# Patient Record
Sex: Female | Born: 1990 | Hispanic: Yes | Marital: Married | Smoking: Never smoker
Health system: Southern US, Community
[De-identification: ages and names within clinical notes are randomized; demographics above are authoritative.]

## PROBLEM LIST (undated history)

## (undated) ENCOUNTER — Inpatient Hospital Stay (HOSPITAL_COMMUNITY): Payer: Self-pay

## (undated) DIAGNOSIS — F419 Anxiety disorder, unspecified: Secondary | ICD-10-CM

## (undated) DIAGNOSIS — Z8619 Personal history of other infectious and parasitic diseases: Secondary | ICD-10-CM

## (undated) DIAGNOSIS — O339 Maternal care for disproportion, unspecified: Secondary | ICD-10-CM

## (undated) DIAGNOSIS — L910 Hypertrophic scar: Secondary | ICD-10-CM

## (undated) DIAGNOSIS — R51 Headache: Secondary | ICD-10-CM

## (undated) DIAGNOSIS — Z87440 Personal history of urinary (tract) infections: Secondary | ICD-10-CM

## (undated) DIAGNOSIS — R519 Headache, unspecified: Secondary | ICD-10-CM

## (undated) HISTORY — DX: Headache, unspecified: R51.9

## (undated) HISTORY — DX: Anxiety disorder, unspecified: F41.9

## (undated) HISTORY — DX: Hypertrophic scar: L91.0

## (undated) HISTORY — DX: Personal history of urinary (tract) infections: Z87.440

## (undated) HISTORY — DX: Headache: R51

## (undated) HISTORY — DX: Personal history of other infectious and parasitic diseases: Z86.19

---

## 2013-03-08 LAB — OB RESULTS CONSOLE ANTIBODY SCREEN: ANTIBODY SCREEN: NEGATIVE

## 2013-03-08 LAB — OB RESULTS CONSOLE HEPATITIS B SURFACE ANTIGEN: Hepatitis B Surface Ag: NEGATIVE

## 2013-03-08 LAB — OB RESULTS CONSOLE GC/CHLAMYDIA
Chlamydia: NEGATIVE
GC PROBE AMP, GENITAL: NEGATIVE

## 2013-03-08 LAB — OB RESULTS CONSOLE RPR: RPR: NONREACTIVE

## 2013-03-08 LAB — OB RESULTS CONSOLE ABO/RH: RH Type: POSITIVE

## 2013-03-08 LAB — OB RESULTS CONSOLE HIV ANTIBODY (ROUTINE TESTING): HIV: NONREACTIVE

## 2013-06-18 ENCOUNTER — Encounter (HOSPITAL_COMMUNITY): Payer: Self-pay | Admitting: *Deleted

## 2013-06-18 ENCOUNTER — Inpatient Hospital Stay (HOSPITAL_COMMUNITY)
Admission: AD | Admit: 2013-06-18 | Discharge: 2013-06-18 | Disposition: A | Discharge status: Home / Self Care | Payer: BC Managed Care – PPO | Source: Ambulatory Visit | Attending: Obstetrics | Admitting: Obstetrics

## 2013-06-18 ENCOUNTER — Inpatient Hospital Stay (HOSPITAL_COMMUNITY): Payer: BC Managed Care – PPO | Admitting: Anesthesiology

## 2013-06-18 ENCOUNTER — Encounter (HOSPITAL_COMMUNITY)
Admission: AD | Disposition: A | Discharge status: Home / Self Care | Payer: Self-pay | Source: Ambulatory Visit | Attending: Obstetrics & Gynecology

## 2013-06-18 ENCOUNTER — Inpatient Hospital Stay (HOSPITAL_COMMUNITY)
Admission: AD | Admit: 2013-06-18 | Discharge: 2013-06-20 | DRG: 765 | Disposition: A | Discharge status: Home / Self Care | Payer: BC Managed Care – PPO | Source: Ambulatory Visit | Attending: Obstetrics & Gynecology | Admitting: Obstetrics & Gynecology

## 2013-06-18 ENCOUNTER — Encounter (HOSPITAL_COMMUNITY): Payer: BC Managed Care – PPO | Admitting: Anesthesiology

## 2013-06-18 ENCOUNTER — Encounter (HOSPITAL_COMMUNITY): Payer: Self-pay | Admitting: Anesthesiology

## 2013-06-18 DIAGNOSIS — Z9889 Other specified postprocedural states: Secondary | ICD-10-CM

## 2013-06-18 DIAGNOSIS — O411090 Infection of amniotic sac and membranes, unspecified, unspecified trimester, not applicable or unspecified: Secondary | ICD-10-CM | POA: Diagnosis present

## 2013-06-18 DIAGNOSIS — O480 Post-term pregnancy: Principal | ICD-10-CM | POA: Diagnosis present

## 2013-06-18 DIAGNOSIS — D689 Coagulation defect, unspecified: Secondary | ICD-10-CM | POA: Diagnosis present

## 2013-06-18 DIAGNOSIS — O339 Maternal care for disproportion, unspecified: Secondary | ICD-10-CM | POA: Diagnosis not present

## 2013-06-18 DIAGNOSIS — O324XX0 Maternal care for high head at term, not applicable or unspecified: Secondary | ICD-10-CM | POA: Diagnosis present

## 2013-06-18 DIAGNOSIS — D62 Acute posthemorrhagic anemia: Secondary | ICD-10-CM | POA: Diagnosis not present

## 2013-06-18 DIAGNOSIS — O479 False labor, unspecified: Secondary | ICD-10-CM | POA: Insufficient documentation

## 2013-06-18 DIAGNOSIS — D696 Thrombocytopenia, unspecified: Secondary | ICD-10-CM | POA: Diagnosis present

## 2013-06-18 DIAGNOSIS — O9912 Other diseases of the blood and blood-forming organs and certain disorders involving the immune mechanism complicating childbirth: Secondary | ICD-10-CM

## 2013-06-18 DIAGNOSIS — O3660X0 Maternal care for excessive fetal growth, unspecified trimester, not applicable or unspecified: Secondary | ICD-10-CM | POA: Diagnosis present

## 2013-06-18 DIAGNOSIS — O9903 Anemia complicating the puerperium: Secondary | ICD-10-CM | POA: Diagnosis not present

## 2013-06-18 HISTORY — DX: Maternal care for disproportion, unspecified: O33.9

## 2013-06-18 LAB — CBC
HCT: 35.4 % — ABNORMAL LOW (ref 36.0–46.0)
Hemoglobin: 12.4 g/dL (ref 12.0–15.0)
MCH: 30.2 pg (ref 26.0–34.0)
MCHC: 35 g/dL (ref 30.0–36.0)
MCV: 86.3 fL (ref 78.0–100.0)
Platelets: 143 10*3/uL — ABNORMAL LOW (ref 150–400)
RBC: 4.1 MIL/uL (ref 3.87–5.11)
RDW: 13.2 % (ref 11.5–15.5)
WBC: 17.7 10*3/uL — ABNORMAL HIGH (ref 4.0–10.5)

## 2013-06-18 LAB — OB RESULTS CONSOLE GBS: GBS: NEGATIVE

## 2013-06-18 LAB — OB RESULTS CONSOLE RUBELLA ANTIBODY, IGM: RUBELLA: IMMUNE

## 2013-06-18 SURGERY — Surgical Case
Anesthesia: Spinal | Site: Abdomen

## 2013-06-18 MED ORDER — OXYTOCIN 10 UNIT/ML IJ SOLN: 10.0000 [IU] | Freq: Once | INTRAMUSCULAR | Status: DC

## 2013-06-18 MED ORDER — KETOROLAC TROMETHAMINE 60 MG/2ML IM SOLN
60.0000 mg | Freq: Once | INTRAMUSCULAR | Status: AC | PRN
  Administered 2013-06-18: 60 mg via INTRAMUSCULAR

## 2013-06-18 MED ORDER — SCOPOLAMINE 1 MG/3DAYS TD PT72
MEDICATED_PATCH | TRANSDERMAL | Status: AC
  Filled 2013-06-18: qty 1

## 2013-06-18 MED ORDER — LACTATED RINGERS IV SOLN
INTRAVENOUS | Status: DC | PRN
  Administered 2013-06-18 (×3): via INTRAVENOUS

## 2013-06-18 MED ORDER — LACTATED RINGERS IV SOLN
INTRAVENOUS | Status: DC | PRN
  Administered 2013-06-18 (×2): via INTRAVENOUS

## 2013-06-18 MED ORDER — ONDANSETRON HCL 4 MG/2ML IJ SOLN
INTRAMUSCULAR | Status: AC
  Filled 2013-06-18: qty 2

## 2013-06-18 MED ORDER — PHENYLEPHRINE 8 MG IN D5W 100 ML (0.08MG/ML) PREMIX OPTIME
INJECTION | INTRAVENOUS | Status: DC | PRN
  Administered 2013-06-18: 60 ug/min via INTRAVENOUS

## 2013-06-18 MED ORDER — CEFAZOLIN SODIUM-DEXTROSE 2-3 GM-% IV SOLR
INTRAVENOUS | Status: DC | PRN
  Administered 2013-06-18: 2 g via INTRAVENOUS

## 2013-06-18 MED ORDER — KETOROLAC TROMETHAMINE 60 MG/2ML IM SOLN
INTRAMUSCULAR | Status: AC
  Filled 2013-06-18: qty 2

## 2013-06-18 MED ORDER — FENTANYL CITRATE 0.05 MG/ML IJ SOLN
INTRAMUSCULAR | Status: AC
  Filled 2013-06-18: qty 2

## 2013-06-18 MED ORDER — CITRIC ACID-SODIUM CITRATE 334-500 MG/5ML PO SOLN
30.0000 mL | ORAL | Status: DC | PRN
  Administered 2013-06-18: 30 mL via ORAL
  Filled 2013-06-18: qty 15

## 2013-06-18 MED ORDER — ONDANSETRON HCL 4 MG/2ML IJ SOLN
INTRAMUSCULAR | Status: DC | PRN
  Administered 2013-06-18: 4 mg via INTRAVENOUS

## 2013-06-18 MED ORDER — IBUPROFEN 600 MG PO TABS: 600.0000 mg | ORAL_TABLET | Freq: Four times a day (QID) | ORAL | Status: DC | PRN

## 2013-06-18 MED ORDER — MORPHINE SULFATE 0.5 MG/ML IJ SOLN
INTRAMUSCULAR | Status: AC
  Filled 2013-06-18: qty 10

## 2013-06-18 MED ORDER — BUPIVACAINE HCL (PF) 0.25 % IJ SOLN
INTRAMUSCULAR | Status: DC | PRN
  Administered 2013-06-18: 10 mL

## 2013-06-18 MED ORDER — EPHEDRINE 5 MG/ML INJ
INTRAVENOUS | Status: AC
  Filled 2013-06-18: qty 10

## 2013-06-18 MED ORDER — PHENYLEPHRINE 8 MG IN D5W 100 ML (0.08MG/ML) PREMIX OPTIME
INJECTION | INTRAVENOUS | Status: AC
  Filled 2013-06-18: qty 100

## 2013-06-18 MED ORDER — LIDOCAINE HCL (PF) 1 % IJ SOLN
30.0000 mL | INTRAMUSCULAR | Status: DC | PRN
  Filled 2013-06-18: qty 30

## 2013-06-18 MED ORDER — MORPHINE SULFATE (PF) 0.5 MG/ML IJ SOLN
INTRAMUSCULAR | Status: DC | PRN
  Administered 2013-06-18: .1 mg via INTRATHECAL

## 2013-06-18 MED ORDER — ACETAMINOPHEN 325 MG PO TABS: 650.0000 mg | ORAL_TABLET | ORAL | Status: DC | PRN

## 2013-06-18 MED ORDER — LACTATED RINGERS IV BOLUS (SEPSIS)
500.0000 mL | Freq: Once | INTRAVENOUS | Status: AC
  Administered 2013-06-18: 17:00:00 via INTRAVENOUS

## 2013-06-18 MED ORDER — BUPIVACAINE HCL (PF) 0.25 % IJ SOLN
INTRAMUSCULAR | Status: AC
  Filled 2013-06-18: qty 30

## 2013-06-18 MED ORDER — SCOPOLAMINE 1 MG/3DAYS TD PT72
1.0000 | MEDICATED_PATCH | Freq: Once | TRANSDERMAL | Status: DC
  Administered 2013-06-18: 1.5 mg via TRANSDERMAL

## 2013-06-18 MED ORDER — LACTATED RINGERS IV SOLN: 500.0000 mL | INTRAVENOUS | Status: DC | PRN

## 2013-06-18 MED ORDER — FENTANYL CITRATE 0.05 MG/ML IJ SOLN
INTRAMUSCULAR | Status: DC | PRN
  Administered 2013-06-18: 15 ug via INTRATHECAL

## 2013-06-18 MED ORDER — OXYTOCIN 10 UNIT/ML IJ SOLN
INTRAMUSCULAR | Status: AC
  Filled 2013-06-18: qty 4

## 2013-06-18 MED ORDER — OXYTOCIN 40 UNITS IN LACTATED RINGERS INFUSION - SIMPLE MED
INTRAVENOUS | Status: AC
  Filled 2013-06-18: qty 1000

## 2013-06-18 MED ORDER — OXYTOCIN 10 UNIT/ML IJ SOLN
INTRAMUSCULAR | Status: DC | PRN
  Administered 2013-06-18: 40 [IU]

## 2013-06-18 MED ORDER — CEFAZOLIN SODIUM-DEXTROSE 2-3 GM-% IV SOLR
INTRAVENOUS | Status: AC
  Filled 2013-06-18: qty 50

## 2013-06-18 MED ORDER — BUPIVACAINE IN DEXTROSE 0.75-8.25 % IT SOLN
INTRATHECAL | Status: DC | PRN
  Administered 2013-06-18: 1.2 mL via INTRATHECAL

## 2013-06-18 SURGICAL SUPPLY — 32 items
CLAMP CORD UMBIL (MISCELLANEOUS) ×2 IMPLANT
CLOTH BEACON ORANGE TIMEOUT ST (SAFETY) ×2 IMPLANT
DRAPE LG THREE QUARTER DISP (DRAPES) ×2 IMPLANT
DRSG OPSITE POSTOP 4X10 (GAUZE/BANDAGES/DRESSINGS) ×2 IMPLANT
DURAPREP 26ML APPLICATOR (WOUND CARE) ×2 IMPLANT
ELECT REM PT RETURN 9FT ADLT (ELECTROSURGICAL) ×2
ELECTRODE REM PT RTRN 9FT ADLT (ELECTROSURGICAL) ×1 IMPLANT
GLOVE BIO SURGEON STRL SZ 6.5 (GLOVE) ×2 IMPLANT
GLOVE BIOGEL PI IND STRL 7.0 (GLOVE) ×1 IMPLANT
GLOVE BIOGEL PI INDICATOR 7.0 (GLOVE) ×1
GOWN STRL REUS W/TWL LRG LVL3 (GOWN DISPOSABLE) ×4 IMPLANT
KIT ABG SYR 3ML LUER SLIP (SYRINGE) ×2 IMPLANT
NEEDLE HYPO 22GX1.5 SAFETY (NEEDLE) ×2 IMPLANT
NEEDLE HYPO 25X5/8 SAFETYGLIDE (NEEDLE) ×2 IMPLANT
PACK C SECTION WH (CUSTOM PROCEDURE TRAY) ×2 IMPLANT
PAD ABD 8X7 1/2 STERILE (GAUZE/BANDAGES/DRESSINGS) ×2 IMPLANT
PAD OB MATERNITY 4.3X12.25 (PERSONAL CARE ITEMS) ×2 IMPLANT
RTRCTR C-SECT PINK 25CM LRG (MISCELLANEOUS) ×2 IMPLANT
SPONGE GAUZE 4X4 12PLY STER LF (GAUZE/BANDAGES/DRESSINGS) ×2 IMPLANT
SUT PLAIN 2 0 (SUTURE) ×1
SUT PLAIN ABS 2-0 CT1 27XMFL (SUTURE) ×1 IMPLANT
SUT VIC AB 0 CT1 27 (SUTURE) ×2
SUT VIC AB 0 CT1 27XBRD ANBCTR (SUTURE) ×2 IMPLANT
SUT VIC AB 0 CTX 36 (SUTURE) ×2
SUT VIC AB 0 CTX36XBRD ANBCTRL (SUTURE) ×2 IMPLANT
SUT VIC AB 2-0 CT1 27 (SUTURE) ×1
SUT VIC AB 2-0 CT1 TAPERPNT 27 (SUTURE) ×1 IMPLANT
SUT VIC AB 3-0 SH 27 (SUTURE) ×1
SUT VIC AB 3-0 SH 27X BRD (SUTURE) ×1 IMPLANT
SYR CONTROL 10ML LL (SYRINGE) ×2 IMPLANT
TOWEL OR 17X24 6PK STRL BLUE (TOWEL DISPOSABLE) ×2 IMPLANT
TRAY FOLEY CATH 14FR (SET/KITS/TRAYS/PACK) ×2 IMPLANT

## 2013-06-18 NOTE — Progress Notes (Signed)
Changed telemetry monitors with external monitors.

## 2013-06-18 NOTE — Progress Notes (Signed)
Pt does not want to be checked.

## 2013-06-18 NOTE — Discharge Instructions (Signed)
Braxton Hicks Contractions Pregnancy is commonly associated with contractions of the uterus throughout the pregnancy. Towards the end of pregnancy (32 to 34 weeks), these contractions (Braxton Hicks) can develop more often and may become more forceful. This is not true labor because these contractions do not result in opening (dilatation) and thinning of the cervix. They are sometimes difficult to tell apart from true labor because these contractions can be forceful and people have different pain tolerances. You should not feel embarrassed if you go to the hospital with false labor. Sometimes, the only way to tell if you are in true labor is for your caregiver to follow the changes in the cervix. How to tell the difference between true and false labor:  False labor.  The contractions of false labor are usually shorter, irregular and not as hard as those of true labor.  They are often felt in the front of the lower abdomen and in the groin.  They may leave with walking around or changing positions while lying down.  They get weaker and are shorter lasting as time goes on.  These contractions are usually irregular.  They do not usually become progressively stronger, regular and closer together as with true labor.  True labor.  Contractions in true labor last 30 to 70 seconds, become very regular, usually become more intense, and increase in frequency.  They do not go away with walking.  The discomfort is usually felt in the top of the uterus and spreads to the lower abdomen and low back.  True labor can be determined by your caregiver with an exam. This will show that the cervix is dilating and getting thinner. If there are no prenatal problems or other health problems associated with the pregnancy, it is completely safe to be sent home with false labor and await the onset of true labor. HOME CARE INSTRUCTIONS   Keep up with your usual exercises and instructions.  Take medications as  directed.  Keep your regular prenatal appointment.  Eat and drink lightly if you think you are going into labor.  If BH contractions are making you uncomfortable:  Change your activity position from lying down or resting to walking/walking to resting.  Sit and rest in a tub of warm water.  Drink 2 to 3 glasses of water. Dehydration may cause B-H contractions.  Do slow and deep breathing several times an hour. SEEK IMMEDIATE MEDICAL CARE IF:   Your contractions continue to become stronger, more regular, and closer together.  You have a gushing, burst or leaking of fluid from the vagina.  An oral temperature above 102 F (38.9 C) develops.  You have passage of blood-tinged mucus.  You develop vaginal bleeding.  You develop continuous belly (abdominal) pain.  You have low back pain that you never had before.  You feel the baby's head pushing down causing pelvic pressure.  The baby is not moving as much as it used to. Document Released: 02/07/2005 Document Revised: 05/02/2011 Document Reviewed: 11/19/2012 [employer] Patient Information 2014 [employer].  Fetal Movement Counts Patient Name: __________________________________________________ Patient Due Date: ____________________ Performing a fetal movement count is highly recommended in high-risk pregnancies, but it is good for every pregnant woman to do. Your caregiver may ask you to start counting fetal movements at 28 weeks of the pregnancy. Fetal movements often increase:  After eating a full meal.  After physical activity.  After eating or drinking something sweet or cold.  At rest. Pay attention to when you feel   the baby is most active. This will help you notice a pattern of your baby's sleep and wake cycles and what factors contribute to an increase in fetal movement. It is important to perform a fetal movement count at the same time each day when your baby is normally most active.  HOW TO COUNT FETAL  MOVEMENTS 1. Find a quiet and comfortable area to sit or lie down on your left side. Lying on your left side provides the best blood and oxygen circulation to your baby. 2. Write down the day and time on a sheet of paper or in a journal. 3. Start counting kicks, flutters, swishes, rolls, or jabs in a 2 hour period. You should feel at least 10 movements within 2 hours. 4. If you do not feel 10 movements in 2 hours, wait 2 3 hours and count again. Look for a change in the pattern or not enough counts in 2 hours. SEEK MEDICAL CARE IF:  You feel less than 10 counts in 2 hours, tried twice.  There is no movement in over an hour.  The pattern is changing or taking longer each day to reach 10 counts in 2 hours.  You feel the baby is not moving as he or she usually does. Date: ____________ Movements: ____________ Start time: ____________ Finish time: ____________  Date: ____________ Movements: ____________ Start time: ____________ Finish time: ____________ Date: ____________ Movements: ____________ Start time: ____________ Finish time: ____________ Date: ____________ Movements: ____________ Start time: ____________ Finish time: ____________ Date: ____________ Movements: ____________ Start time: ____________ Finish time: ____________ Date: ____________ Movements: ____________ Start time: ____________ Finish time: ____________ Date: ____________ Movements: ____________ Start time: ____________ Finish time: ____________ Date: ____________ Movements: ____________ Start time: ____________ Finish time: ____________  Date: ____________ Movements: ____________ Start time: ____________ Finish time: ____________ Date: ____________ Movements: ____________ Start time: ____________ Finish time: ____________ Date: ____________ Movements: ____________ Start time: ____________ Finish time: ____________ Date: ____________ Movements: ____________ Start time: ____________ Finish time: ____________ Date: ____________  Movements: ____________ Start time: ____________ Finish time: ____________ Date: ____________ Movements: ____________ Start time: ____________ Finish time: ____________ Date: ____________ Movements: ____________ Start time: ____________ Finish time: ____________  Date: ____________ Movements: ____________ Start time: ____________ Finish time: ____________ Date: ____________ Movements: ____________ Start time: ____________ Finish time: ____________ Date: ____________ Movements: ____________ Start time: ____________ Finish time: ____________ Date: ____________ Movements: ____________ Start time: ____________ Finish time: ____________ Date: ____________ Movements: ____________ Start time: ____________ Finish time: ____________ Date: ____________ Movements: ____________ Start time: ____________ Finish time: ____________ Date: ____________ Movements: ____________ Start time: ____________ Finish time: ____________  Date: ____________ Movements: ____________ Start time: ____________ Finish time: ____________ Date: ____________ Movements: ____________ Start time: ____________ Finish time: ____________ Date: ____________ Movements: ____________ Start time: ____________ Finish time: ____________ Date: ____________ Movements: ____________ Start time: ____________ Finish time: ____________ Date: ____________ Movements: ____________ Start time: ____________ Finish time: ____________ Date: ____________ Movements: ____________ Start time: ____________ Finish time: ____________ Date: ____________ Movements: ____________ Start time: ____________ Finish time: ____________  Date: ____________ Movements: ____________ Start time: ____________ Finish time: ____________ Date: ____________ Movements: ____________ Start time: ____________ Finish time: ____________ Date: ____________ Movements: ____________ Start time: ____________ Finish time: ____________ Date: ____________ Movements: ____________ Start time:  ____________ Finish time: ____________ Date: ____________ Movements: ____________ Start time: ____________ Finish time: ____________ Date: ____________ Movements: ____________ Start time: ____________ Finish time: ____________ Date: ____________ Movements: ____________ Start time: ____________ Finish time: ____________  Date: ____________ Movements: ____________ Start time: ____________ Finish time: ____________ Date: ____________ Movements: ____________ Start   time: ____________ Finish time: ____________ Date: ____________ Movements: ____________ Start time: ____________ Finish time: ____________ Date: ____________ Movements: ____________ Start time: ____________ Finish time: ____________ Date: ____________ Movements: ____________ Start time: ____________ Finish time: ____________ Date: ____________ Movements: ____________ Start time: ____________ Finish time: ____________ Date: ____________ Movements: ____________ Start time: ____________ Finish time: ____________  Date: ____________ Movements: ____________ Start time: ____________ Finish time: ____________ Date: ____________ Movements: ____________ Start time: ____________ Finish time: ____________ Date: ____________ Movements: ____________ Start time: ____________ Finish time: ____________ Date: ____________ Movements: ____________ Start time: ____________ Finish time: ____________ Date: ____________ Movements: ____________ Start time: ____________ Finish time: ____________ Date: ____________ Movements: ____________ Start time: ____________ Finish time: ____________ Date: ____________ Movements: ____________ Start time: ____________ Finish time: ____________  Date: ____________ Movements: ____________ Start time: ____________ Finish time: ____________ Date: ____________ Movements: ____________ Start time: ____________ Finish time: ____________ Date: ____________ Movements: ____________ Start time: ____________ Finish time: ____________ Date:  ____________ Movements: ____________ Start time: ____________ Finish time: ____________ Date: ____________ Movements: ____________ Start time: ____________ Finish time: ____________ Date: ____________ Movements: ____________ Start time: ____________ Finish time: ____________ Document Released: 03/09/2006 Document Revised: 01/25/2012 Document Reviewed: 12/05/2011 [employer] Patient Information 2014 [employer].  

## 2013-06-18 NOTE — Progress Notes (Signed)
Call from RN: G1P0 @40 .6 wks for labor check. Uncomplicated pregnancy, GBS neg. Ctx q 5 min per RN. SVE 3.5/90/-3, minimal change from exam in office. NST reactive. Pt requests d/c home. Ok for d/c home, labor precautions.

## 2013-06-18 NOTE — Op Note (Signed)
Preoperative diagnosis: Intrauterine pregnancy at 40 weeks and 6 days                                             Arrest of descent and dilation.  Non-reassuring FHR monitoring  Post operative diagnosis: Same  Anesthesia: Spinal  Anesthesiologist: Dr. Dana AllanAmy Cassidy  Procedure: Primary urgent low transverse cesarean section  Surgeon: Dr. Genia DelMarie-Lyne Brodey Bonn  Assistant: Denton Meekolita Dawson   Estimated blood loss: 500 cc  Procedure:  After being informed of the planned procedure and possible complications including bleeding, infection, injury to other organs, informed consent is obtained. The patient is taken to OR #9 and given spinal anesthesia without complication. She is placed in the dorsal decubitus position with the pelvis tilted to the left. She is then prepped and draped in a sterile fashion. A Foley catheter is inserted in her bladder.  After assessing adequate level of anesthesia, we infiltrate the suprapubic area with 10 cc of Marcaine 0.25 and perform a Pfannenstiel incision which is brought down sharply to the fascia. The fascia is entered in a low transverse fashion. Linea alba is dissected. Peritoneum is entered in a midline fashion. An Alexis retractor is easily positioned. Visceral peritoneum is entered in a low transverse fashion allowing us to safely retract bladder by developing a bladder flap.  The myometrium is then entered in a low transverse fashion; first with knife and then extended bluntly. Amniotic fluid is meconium. We assist the birth of a female  infant in cephalic presentation. Mouth and nose are suctioned. The baby is delivered. The cord is clamped and sectioned. The baby is given to the neonatologist present in the room.  An arterial cord PH is done and 10 cc of blood is drawn from the umbilical vein.The placenta is allowed to deliver spontaneously. It is complete and the cord has 3 vessels. Uterine revision is negative.  We proceed with closure of the myometrium in 2  layers: First with a running locked suture of 0 Vicryl, then with a Lembert suture of 0 Vicryl imbricating the first one. Hemostasis is completed with cauterization on peritoneal edges.  Both paracolic gutters are cleaned. Both tubes and ovaries are assessed and normal. We confirm a satisfactory hemostasis.  Retractors and sponges are removed. Under fascia hemostasis is completed with cauterization.  The parietal peritoneum is closed with a running suture of Vicryl 2-0. The fascia is then closed with 2 running sutures of 0 Vicryl meeting midline. The wound is irrigated with warm saline and hemostasis is completed with cauterization.  The adipose tissue is reapproximated with Plain 2-0 in a running suture. The skin is closed with a subcuticular suture of 3-0 Monocryl and a pressure dressing is applied with a Honeycomb dressing.  Instrument and sponge count is complete x2. Estimated blood loss is 500 cc.  The procedure is well tolerated by the patient who is taken to recovery room in a well and stable condition.  female baby was born at 21:45 and received an Apgar of 9  at 1 minute and 9 at 5 minutes.  PH was 7.12.  Weight was pending.   Specimen: Placenta sent to pathology   Genia DelMarie-Lyne Eara Burruel MD 4/28/201510:45 PM

## 2013-06-18 NOTE — Progress Notes (Signed)
Patient ID: Norma Garcia, female   DOB: 1990/07/15, 23 y.o.   MRN: [id number] S: Becoming increasingly frustrated and exhausted, pain more intense in lower abdomen. Requesting epidural/ (+) pelvic pressure with every contraction.   O: Filed Vitals:   06/18/13 1502 06/18/13 1625 06/18/13 1807 06/18/13 1812  BP: 111/69   135/64  Pulse: 87   90  Temp: 98.3 F (36.8 C) 98.6 F (37 C) 98.7 F (37.1 C)   TempSrc: Oral Oral Oral   Resp: 20   22     FHT:  FHR: 150 bpm, variability: moderate,  accelerations:  Present,  decelerations:  Present variables UC:   regular, every 2-3 minutes SVE:   Dilation: 9 Effacement (%): 90 Station: 0;+1 Exam by:: Carloyn Jaeger Wash Nienhaus CNM   A / P: Spontaneous labor, progressing normally  Fetal Wellbeing:  Category II Pain Control:  Labor support without medications  Anticipated MOD:  NSVD  Kenard Gowerolitta M Eva Griffo, MSN, CNM 06/18/2013, 6:30 PM

## 2013-06-18 NOTE — Progress Notes (Addendum)
Patient ID: Norma Garcia, female   DOB: 1990/07/29, 23 y.o.   MRN: [id number] S: Doing well, increasing back pain - requesting sterile water papules. (+) pelvic pressure, involuntarily pushing.   Norma Garcia: Filed Vitals:   06/18/13 1502 06/18/13 1625  BP: 111/69   Pulse: 87   Temp: 98.3 F (36.8 C) 98.6 F (37 C)  TempSrc: Oral Oral  Resp: 20      FHT:  FHR: 145 bpm, variability: moderate,  accelerations:  Present,  decelerations:  Present variables UC:   regular, every 2-3 minutes, by palpation SVE:   Dilation: 8 Effacement (%): 90 Station: 0 Exam by:: R Macedonio Scallon CNM   A / P: Spontaneous labor, progressing normally  Fetal Wellbeing:  Category II Pain Control:  Labor support without medications and sterile water papules  Anticipated MOD:  NSVD  *Dr. Seymour BarsLavoie present at time of exam / introduced self to patient / reviewed EFM tracing  Kenard Gowerolitta M Quentyn Kolbeck, MSN, CNM 06/18/2013, 4:50 PM

## 2013-06-18 NOTE — Anesthesia Postprocedure Evaluation (Signed)
  Anesthesia Post-op Note  Anesthesia Post Note  Patient: Norma Garcia  Procedure(s) Performed: Procedure(s) (LRB): CESAREAN SECTION (N/A)  Anesthesia type: Spinal  Patient location: PACU  Post pain: Pain level controlled  Post assessment: Post-op Vital signs reviewed  Last Vitals:  Filed Vitals:   06/18/13 2330  BP: 115/87  Pulse: 98  Temp:   Resp: 25    Post vital signs: Reviewed  Level of consciousness: awake  Complications: No apparent anesthesia complications

## 2013-06-18 NOTE — Consult Note (Signed)
Neonatology Note:   Attendance at C-section:    I was asked by Dr. Seymour BarsLavoie to attend this primary C/S at term due [facility]. The mother is a G1P0 O pos, GBS neg with an uncomplicated pregnancy. The baby is known to be LGA. ROM 6 hours prior to delivery, fluid with moderate meconium. Infant vigorous with good spontaneous cry and tone. Needed only minimal bulb suctioning for clear mucous. Ap 9/9. Lungs with few crackles in DR. At 6 minutes, he had some minimal soft grunting, so we did chest PT and he cried vigorously, after which he was improved. I spoke with his parents. I instructed his nurse to allow him to have skin to skin time, being observed closely, and to take him to the CN if he had any increase in the grunting or other respiratory distress. To CN to care of Pediatrician.   Norma Souhristie C. Alli Jasmer, MD

## 2013-06-18 NOTE — Progress Notes (Signed)
G1P0 at 40 6/7 weeks admitted to L+D for waterbirth.  Given birth plan and reviewed with pt and family and support team.  After reactive NST, pt may get in tub per T Fredric MareBailey CNM.

## 2013-06-18 NOTE — Anesthesia Preprocedure Evaluation (Signed)
Anesthesia Evaluation  Patient identified by MRN, date of birth, ID band Patient awake    Reviewed: Allergy & Precautions, H&P , NPO status , Patient's Chart, lab work & pertinent test results, reviewed documented beta blocker date and time   History of Anesthesia Complications Negative for: history of anesthetic complications  Airway Mallampati: II TM Distance: >3 FB Neck ROM: full    Dental  (+) Teeth Intact   Pulmonary neg pulmonary ROS,  breath sounds clear to auscultation        Cardiovascular negative cardio ROS  Rhythm:regular Rate:Normal     Neuro/Psych negative neurological ROS  negative psych ROS   GI/Hepatic negative GI ROS, Neg liver ROS,   Endo/Other  negative endocrine ROS  Renal/GU negative Renal ROS     Musculoskeletal   Abdominal   Peds  Hematology negative hematology ROS (+)   Anesthesia Other Findings   Reproductive/Obstetrics (+) Pregnancy (failure to progress -> C/S)                           Anesthesia Physical Anesthesia Plan  ASA: II and emergent  Anesthesia Plan: Spinal   Post-op Pain Management:    Induction:   Airway Management Planned:   Additional Equipment:   Intra-op Plan:   Post-operative Plan:   Informed Consent: I have reviewed the patients History and Physical, chart, labs and discussed the procedure including the risks, benefits and alternatives for the proposed anesthesia with the patient or authorized representative who has indicated his/her understanding and acceptance.     Plan Discussed with: Surgeon and CRNA  Anesthesia Plan Comments:         Anesthesia Quick Evaluation

## 2013-06-18 NOTE — Anesthesia Procedure Notes (Signed)
Spinal  Patient location during procedure: OR Start time: 06/18/2013 9:25 PM Staffing Performed by: anesthesiologist  Preanesthetic Checklist Completed: patient identified, site marked, surgical consent, pre-op evaluation, timeout performed, IV checked, risks and benefits discussed and monitors and equipment checked Spinal Block Patient position: sitting Prep: site prepped and draped and DuraPrep Patient monitoring: heart rate, cardiac monitor, continuous pulse ox and blood pressure Approach: midline Location: L3-4 Injection technique: single-shot Needle Needle type: Pencan  Needle gauge: 24 G Needle length: 9 cm Assessment Sensory level: T4 Additional Notes Clear free flow CSF on second attempt (after repositioning).  No paresthesia.  Patient tolerated procedure well with no apparent complications.  Jasmine DecemberA. Cassidy, MD

## 2013-06-18 NOTE — MAU Note (Signed)
Contractions  

## 2013-06-18 NOTE — Progress Notes (Signed)
S:  Breathing well with ctx       Position changes - now hands-knees  O:  VS: Blood pressure 111/69, pulse 87, temperature 98.3 F (36.8 C), temperature source Oral, resp. rate 20.        FHR : baseline 165 / variability moderaet / accelerations absent / no decelerations        Toco: contractions every 3-5 minutes      A: Active labor     FHR category 2 with increasing baseline  P:  Report to Raelyn Moraolitta Dawson - CNM on-call       Update dr Hennie DuosLavoie   Norma Garcia CNM, MSN, [facility] 06/18/2013, 4:25 PM

## 2013-06-18 NOTE — Progress Notes (Signed)
S:   Coping well with labor - laboring at bedside         lateral side lying        doula at bedside offering labor support  O:  VS: Blood pressure 111/69, pulse 87, temperature 98.3 F (36.8 C), temperature source Oral, resp. rate 20.        FHR : baseline 145 / variability moderate / accelerations occasional 10x10 - not reactive since admission                  Few mild variable decelerations to nadir 110-120        Toco: contractions every 3-5 minutes / moderate-strong         Cervix : 8cm / 95% / vtx / 0 station / LOT        Membranes: BBOW - AROM - thick MSF / non-particulate  A: active labor - LOT at 0 station      FHR category 2      not candidate for water birth - FHR tracing category 2 and thick MSF   P:  discussed with patient and doula and spouse - not candidate for water birth reminded option for pain management - declines at this time continuous EFM in labor continuous labor support monitor closely for fetal descent re-evaluate cervical progression and descent in 1-2 hours        Marlinda Mikeanya Sherrel Ploch CNM, MSN, [facility] 06/18/2013, 3:47 PM

## 2013-06-18 NOTE — H&P (Signed)
  OB ADMISSION/ HISTORY & PHYSICAL:  Admission Date: 06/18/2013  1:59 PM  Admit Diagnosis: active labor @ 40.6 weeks  Norma Garcia is a 23 y.o. female presenting for onset of labor - prodromal ctx since 2pm yesterday - stronger ~ midnight. Seen in MAU 0200 - no cervical progression. Worsening of ctx and pressure @ 0500 - admit from office at 6-7cm dilation with BBOW.  Prenatal History: G1P0   EDC : 06/12/2013, Set as working upon episode Administrator, [employer]  [facility]  Primary Ob Provider: Dr Ernestina PennaFogleman - seeing CNMs for waterbirth Prenatal course complicated by postdates  Prenatal Labs: ABO, Rh:   O positive Antibody:  negative Rubella:   Immune RPR:   NR HBsAg:   negative HIV:   NR GTT: normal GBS: Negative (04/28 0000)   Medical / Surgical History :  Past medical history: No past medical history on file.   Past surgical history: No past surgical history on file.  Family History: No family history on file.   Social History:  has no tobacco, alcohol, and drug history on file.   Allergies: Reglan    Current Medications at time of admission:  Prior to Admission medications   Medication Sig Start Date End Date Taking? Authorizing Provider  Omega-3 Fatty Acids (FISH OIL) 1000 MG CAPS Take by mouth.    Historical Provider, MD  Prenatal Vit-Fe Fumarate-FA (PRENATAL MULTIVITAMIN) TABS tablet Take 1 tablet by mouth daily at 12 noon.    Historical Provider, MD   Review of Systems: Active FM currently every 3-5 minutes No LOF - membranes bulging in office bloody show present  Physical Exam:  VS: 98.1 - 83 - 20 - 120/79  General: alert and oriented, appears uncomfortable with ctx Heart: RRR Lungs: Clear lung fields Abdomen: Gravid, soft and non-tender, non-distended / uterus: gravid and non-tender Extremities: 1+ pedal edema  Genitalia / VE: in office Dilation: 6.5 Effacement (%): 90 Station: -1  FHR: baseline rate 145 / variability  moderate / accelerations 10x10  occasional variable to 120 TOCO: 3-5  Assessment: 40.[redacted] weeks gestation active stage of labor FHR category 2   Plan:  Admit - expectant management Plan water immersion intrapartum - with water birth after reactive NST  Dr Seymour BarsLavoie notified of admission / plan of care   [name] CNM, MSN, [facility] 06/18/2013, 2:11 PM

## 2013-06-18 NOTE — Transfer of Care (Signed)
Immediate Anesthesia Transfer of Care Note  Patient: Norma Garcia  Procedure(s) Performed: Procedure(s): CESAREAN SECTION (N/A)  Patient Location: PACU  Anesthesia Type:Spinal  Level of Consciousness: awake, alert , oriented and patient cooperative  Airway & Oxygen Therapy: Patient Spontanous Breathing  Post-op Assessment: Report given to PACU RN and Post -op Vital signs reviewed and stable  Post vital signs: Reviewed and stable  Complications: No apparent anesthesia complications

## 2013-06-19 ENCOUNTER — Encounter (HOSPITAL_COMMUNITY): Payer: Self-pay | Admitting: *Deleted

## 2013-06-19 DIAGNOSIS — O339 Maternal care for disproportion, unspecified: Secondary | ICD-10-CM

## 2013-06-19 DIAGNOSIS — Z9889 Other specified postprocedural states: Secondary | ICD-10-CM

## 2013-06-19 HISTORY — DX: Maternal care for disproportion, unspecified: O33.9

## 2013-06-19 LAB — RPR

## 2013-06-19 LAB — CBC
HEMATOCRIT: 29.1 % — AB (ref 36.0–46.0)
HEMOGLOBIN: 10.1 g/dL — AB (ref 12.0–15.0)
MCH: 30.1 pg (ref 26.0–34.0)
MCHC: 34.7 g/dL (ref 30.0–36.0)
MCV: 86.6 fL (ref 78.0–100.0)
Platelets: 120 10*3/uL — ABNORMAL LOW (ref 150–400)
RBC: 3.36 MIL/uL — ABNORMAL LOW (ref 3.87–5.11)
RDW: 13.2 % (ref 11.5–15.5)
WBC: 18.2 10*3/uL — ABNORMAL HIGH (ref 4.0–10.5)

## 2013-06-19 LAB — ABO/RH: ABO/RH(D): O POS

## 2013-06-19 MED ORDER — MEPERIDINE HCL 25 MG/ML IJ SOLN: 6.2500 mg | INTRAMUSCULAR | Status: DC | PRN

## 2013-06-19 MED ORDER — DIPHENHYDRAMINE HCL 50 MG/ML IJ SOLN: 25.0000 mg | INTRAMUSCULAR | Status: DC | PRN

## 2013-06-19 MED ORDER — ONDANSETRON HCL 4 MG PO TABS: 4.0000 mg | ORAL_TABLET | ORAL | Status: DC | PRN

## 2013-06-19 MED ORDER — DIBUCAINE 1 % RE OINT: 1.0000 "application " | TOPICAL_OINTMENT | RECTAL | Status: DC | PRN

## 2013-06-19 MED ORDER — ONDANSETRON HCL 4 MG/2ML IJ SOLN: 4.0000 mg | INTRAMUSCULAR | Status: DC | PRN

## 2013-06-19 MED ORDER — NALOXONE HCL 0.4 MG/ML IJ SOLN: 0.4000 mg | INTRAMUSCULAR | Status: DC | PRN

## 2013-06-19 MED ORDER — FERROUS SULFATE 325 (65 FE) MG PO TABS
325.0000 mg | ORAL_TABLET | Freq: Two times a day (BID) | ORAL | Status: DC
  Administered 2013-06-19: 325 mg via ORAL
  Filled 2013-06-19 (×3): qty 1

## 2013-06-19 MED ORDER — KETOROLAC TROMETHAMINE 30 MG/ML IJ SOLN
30.0000 mg | Freq: Four times a day (QID) | INTRAMUSCULAR | Status: AC | PRN
Start: 2013-06-19 — End: 2013-06-20

## 2013-06-19 MED ORDER — NALBUPHINE HCL 10 MG/ML IJ SOLN: 5.0000 mg | INTRAMUSCULAR | Status: DC | PRN

## 2013-06-19 MED ORDER — LANOLIN HYDROUS EX OINT: 1.0000 "application " | TOPICAL_OINTMENT | CUTANEOUS | Status: DC | PRN

## 2013-06-19 MED ORDER — OXYCODONE-ACETAMINOPHEN 5-325 MG PO TABS: 1.0000 | ORAL_TABLET | ORAL | Status: DC | PRN

## 2013-06-19 MED ORDER — SIMETHICONE 80 MG PO CHEW
80.0000 mg | CHEWABLE_TABLET | ORAL | Status: DC
  Administered 2013-06-19 (×2): 80 mg via ORAL
  Filled 2013-06-19 (×2): qty 1

## 2013-06-19 MED ORDER — DIPHENHYDRAMINE HCL 25 MG PO CAPS
25.0000 mg | ORAL_CAPSULE | Freq: Four times a day (QID) | ORAL | Status: DC | PRN
  Administered 2013-06-19: 25 mg via ORAL
  Filled 2013-06-19: qty 1

## 2013-06-19 MED ORDER — MAGNESIUM HYDROXIDE 400 MG/5ML PO SUSP: 30.0000 mL | ORAL | Status: DC | PRN

## 2013-06-19 MED ORDER — NALOXONE HCL 1 MG/ML IJ SOLN
1.0000 ug/kg/h | INTRAVENOUS | Status: DC | PRN
  Filled 2013-06-19: qty 2

## 2013-06-19 MED ORDER — SIMETHICONE 80 MG PO CHEW
80.0000 mg | CHEWABLE_TABLET | Freq: Three times a day (TID) | ORAL | Status: DC
  Administered 2013-06-19 (×2): 80 mg via ORAL
  Filled 2013-06-19 (×4): qty 1

## 2013-06-19 MED ORDER — ZOLPIDEM TARTRATE 5 MG PO TABS: 5.0000 mg | ORAL_TABLET | Freq: Every evening | ORAL | Status: DC | PRN

## 2013-06-19 MED ORDER — ONDANSETRON HCL 4 MG/2ML IJ SOLN: 4.0000 mg | Freq: Three times a day (TID) | INTRAMUSCULAR | Status: DC | PRN

## 2013-06-19 MED ORDER — FENTANYL CITRATE 0.05 MG/ML IJ SOLN: 25.0000 ug | INTRAMUSCULAR | Status: DC | PRN

## 2013-06-19 MED ORDER — SIMETHICONE 80 MG PO CHEW: 80.0000 mg | CHEWABLE_TABLET | ORAL | Status: DC | PRN

## 2013-06-19 MED ORDER — PRENATAL MULTIVITAMIN CH
1.0000 | ORAL_TABLET | Freq: Every day | ORAL | Status: DC
  Administered 2013-06-19: 1 via ORAL
  Filled 2013-06-19 (×2): qty 1

## 2013-06-19 MED ORDER — MENTHOL 3 MG MT LOZG: 1.0000 | LOZENGE | OROMUCOSAL | Status: DC | PRN

## 2013-06-19 MED ORDER — KETOROLAC TROMETHAMINE 30 MG/ML IJ SOLN: 30.0000 mg | Freq: Four times a day (QID) | INTRAMUSCULAR | Status: AC | PRN

## 2013-06-19 MED ORDER — DIPHENHYDRAMINE HCL 25 MG PO CAPS
25.0000 mg | ORAL_CAPSULE | ORAL | Status: DC | PRN
  Administered 2013-06-19: 25 mg via ORAL
  Filled 2013-06-19: qty 1

## 2013-06-19 MED ORDER — ACETAMINOPHEN 160 MG/5ML PO SOLN: 975.0000 mg | Freq: Four times a day (QID) | ORAL | Status: DC | PRN

## 2013-06-19 MED ORDER — LACTATED RINGERS IV SOLN
INTRAVENOUS | Status: DC
  Administered 2013-06-19: 11:00:00 via INTRAVENOUS

## 2013-06-19 MED ORDER — IBUPROFEN 600 MG PO TABS
600.0000 mg | ORAL_TABLET | Freq: Four times a day (QID) | ORAL | Status: DC
  Administered 2013-06-19 – 2013-06-20 (×6): 600 mg via ORAL
  Filled 2013-06-19 (×6): qty 1

## 2013-06-19 MED ORDER — OXYTOCIN 40 UNITS IN LACTATED RINGERS INFUSION - SIMPLE MED: 62.5000 mL/h | INTRAVENOUS | Status: AC

## 2013-06-19 MED ORDER — IBUPROFEN 600 MG PO TABS: 600.0000 mg | ORAL_TABLET | Freq: Four times a day (QID) | ORAL | Status: DC

## 2013-06-19 MED ORDER — DIPHENHYDRAMINE HCL 50 MG/ML IJ SOLN: 12.5000 mg | INTRAMUSCULAR | Status: DC | PRN

## 2013-06-19 MED ORDER — SODIUM CHLORIDE 0.9 % IJ SOLN: 3.0000 mL | INTRAMUSCULAR | Status: DC | PRN

## 2013-06-19 MED ORDER — WITCH HAZEL-GLYCERIN EX PADS: 1.0000 "application " | MEDICATED_PAD | CUTANEOUS | Status: DC | PRN

## 2013-06-19 MED ORDER — TETANUS-DIPHTH-ACELL PERTUSSIS 5-2.5-18.5 LF-MCG/0.5 IM SUSP: 0.5000 mL | Freq: Once | INTRAMUSCULAR | Status: DC

## 2013-06-19 MED ORDER — SENNOSIDES-DOCUSATE SODIUM 8.6-50 MG PO TABS
2.0000 | ORAL_TABLET | ORAL | Status: DC
  Administered 2013-06-19 (×2): 2 via ORAL
  Filled 2013-06-19 (×2): qty 2

## 2013-06-19 NOTE — Addendum Note (Signed)
Addendum created 06/19/13 0909 by Elbert Ewingsolleen S Ajane Novella, CRNA   Modules edited: Notes Section   Notes Section:  File: [id number]

## 2013-06-19 NOTE — Progress Notes (Signed)
POSTOPERATIVE DAY # 1 S/P CS - arrest of active labor / descent / category 2-3 tracing  S:         Reports feeling              Tolerating po intake / no nausea / no vomiting / + flatus / no BM             Bleeding is light             Pain controlled with motrin             Up ad lib / ambulatory/ voiding QS  Newborn breast-feeding    O:  VS: BP 89/62  Pulse 75  Temp(Src) 97.5 F (36.4 C) (Oral)  Resp 20  Ht 5' 0.25" (1.53 m)  Wt 67.586 kg (149 lb)  BMI 28.87 kg/m2  SpO2 99%  Breastfeeding? Unknown   LABS:               Recent Labs  06/18/13 1449 06/19/13 0553  WBC 17.7* 18.2*  HGB 12.4 10.1*  PLT 143* 120*               Bloodtype: --/--/O POS (04/28 1455)  Rubella: Immune (04/28 1700)                                             I&O: Intake/Output     04/28 0701 - 04/29 0700 04/29 0701 - 04/30 0700   I.V. (mL/kg) 2000 (29.6)    Total Intake(mL/kg) 2000 (29.6)    Urine (mL/kg/hr) 2150    Blood 500    Total Output 2650     Net -650                     Physical Exam:             Alert and Oriented X3  Lungs: Clear and unlabored  Heart: regular rate and rhythm / no mumurs  Abdomen: soft, non-tender, non-distended, active BS             Foley draining dark urine - tea colored             Fundus: firm, non-tender, Ueven             Dressing intact pressure            Lochia: light  Extremities: 1+ pedal edema, no calf pain or tenderness, SCD in place  A:        POD # 1 S/P CS            Mild ABL anemia - stable / no intervention             P:        Routine postoperative care                Norma Garcia CNM, MSN, [facility]ACNM 06/19/2013, 9:03 AM

## 2013-06-19 NOTE — Anesthesia Postprocedure Evaluation (Signed)
  Anesthesia Post-op Note  Patient: Norma Garcia  Procedure(s) Performed: Procedure(s): CESAREAN SECTION (N/A)  Patient Location: PACU and Mother/Baby  Anesthesia Type:Spinal  Level of Consciousness: awake, alert  and oriented  Airway and Oxygen Therapy: Patient Spontanous Breathing  Post-op Pain: mild  Post-op Assessment: Patient's Cardiovascular Status Stable, Respiratory Function Stable, No signs of Nausea or vomiting and Pain level controlled  Post-op Vital Signs: stable  Complications: No apparent anesthesia complications

## 2013-06-20 ENCOUNTER — Encounter (HOSPITAL_COMMUNITY): Payer: Self-pay | Admitting: Obstetrics & Gynecology

## 2013-06-20 MED ORDER — OXYCODONE-ACETAMINOPHEN 5-325 MG PO TABS: 1.0000 | ORAL_TABLET | ORAL | Status: DC | PRN

## 2013-06-20 MED ORDER — FERROUS SULFATE 325 (65 FE) MG PO TABS: 325.0000 mg | ORAL_TABLET | Freq: Every day | ORAL | Status: DC

## 2013-06-20 MED ORDER — IBUPROFEN 600 MG PO TABS: 600.0000 mg | ORAL_TABLET | Freq: Four times a day (QID) | ORAL | Status: DC

## 2013-06-20 NOTE — Progress Notes (Signed)
POD # 2  Subjective: Pt reports feeling well, desires early discharge/ Pain controlled with Motrin Tolerating po/Voiding without problems/ No n/v/ Flatus present Activity: ad lib Bleeding is light Newborn info:  Information for the patient's newborn:  Kellie ShropshireJacovozzi, Boy Vanesha [[id number]][[id number]]  female  / Circumcision: not planning/ Feeding: breast   Objective: VS: VS:  Filed Vitals:   06/19/13 1430 06/19/13 1854 06/19/13 2204 06/20/13 0614  BP: 97/58 110/72 120/76 106/57  Pulse: 91 88 103 93  Temp: 97.9 F (36.6 C) 99.2 F (37.3 C) 98.6 F (37 C) 97.6 F (36.4 C)  TempSrc: Oral Oral Oral Oral  Resp: 16 18 18 16   Height:      Weight:      SpO2: 97%   98%    I&O: Intake/Output     04/29 0701 - 04/30 0700 04/30 0701 - 05/01 0700   I.V. (mL/kg)     Total Intake(mL/kg)     Urine (mL/kg/hr) 4500 (2.8)    Blood     Total Output 4500     Net -4500          Urine Occurrence 2 x      LABS:  Recent Labs  06/18/13 1449 06/19/13 0553  WBC 17.7* 18.2*  HGB 12.4 10.1*  PLT 143* 120*                           Physical Exam:  General: alert and cooperative CV: Regular rate and rhythm Resp: CTA bilaterally Abdomen: soft, nontender, normal bowel sounds Incision: healing well, no drainage, no erythema, no hernia, no seroma, no swelling, well approximated, honeycomb c/d/i Uterine Fundus: firm, below umbilicus, nontender Lochia: minimal Ext: extremities normal, atraumatic, no cyanosis or edema and Homans sign is negative, no sign of DVT    Assessment: POD # 2/ G1P1001/ S/P C/Section d/t arrest of descent/dilation Mild ABL anemia Doing well and stable for discharge home  Plan: Discharge home RX's: Ibuprofen 600mg  po Q 6 hrs prn pain #30 Refill x 1 Percocet 5/325 1 - 2 tabs po every 4 hrs prn pain #30 Refill x 0 Niferex 150mg  po QD #30 Refill x 1 [facility] booklet given    Signed: Lawernce PittsMelanie N Acie Custis, MSN, CNM 06/20/2013, 11:36 AM

## 2013-06-20 NOTE — Addendum Note (Signed)
Addendum created 06/20/13 2308 by Dana AllanAmy Leilanie Rauda, MD   Modules edited: Anesthesia Attestations

## 2013-06-20 NOTE — Discharge Summary (Signed)
  POSTOPERATIVE DISCHARGE SUMMARY:  Patient ID: Norma Garcia MRN: [id number] DOB/AGE: [birth year] 22 y.o.  Admit date: 06/18/2013 Admission Diagnoses: 40.[redacted] weeks gestation, active labor   Discharge date: 06/20/2013 Discharge Diagnoses: S/P Primary C/S on 06/18/13, ABL anemia        Prenatal history: G1P1001   EDC : 06/12/2013, Alternate EDD Entry  [facility] since [redacted] wks gestation. Primary provider: Dr. Ernestina PennaFogleman/ CNM for waterbirth Prenatal course complicated by postdates, excessive wt gain  Prenatal labs: ABO, Rh: --/--/O POS (04/28 1455)  Antibody: Negative (01/16 0000) Rubella:  Immune RPR: NON REAC (04/28 1449)  HBsAg: Negative (01/16 0000)  HIV: Non-reactive (01/16 0000)  GBS: Negative (04/28 0000)  GTT: 130  Medical / Surgical History :  Past medical history:  Past Medical History  Diagnosis Date  . Postpartum care following cesarean delivery (4/28) 06/19/2013  . 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 Cesarean section N/A 06/18/2013    Procedure: CESAREAN SECTION;  Surgeon: Genia DelMarie-Lyne Lavoie, MD;  Location: [facility];  Service: Obstetrics;  Laterality: N/A;     Medications on Admission: Prescriptions prior to admission  Medication Sig Dispense Refill  . Prenatal Vit-Fe Fumarate-FA (PRENATAL MULTIVITAMIN) TABS tablet Take 1 tablet by mouth daily at 12 noon.        Allergies: Reglan   Intrapartum Course:  Admited for active labor, AROM with thick MSF, cat II fetal tracing, arrest of dilation and descent, fetal tachycardia, PCS  Postpartum Course:  ABL anemia, Mild Thrombocytopenia  Physical Exam:   VSS: Blood pressure 106/57, pulse 93, temperature 97.6 F (36.4 C), temperature source Oral, resp. rate 16, height 5' 0.25" (1.53 m), weight 67.586 kg (149 lb), SpO2 98.00%, unknown if currently breastfeeding.  LABS:  Recent  Labs  06/18/13 1449 06/19/13 0553  WBC 17.7* 18.2*  HGB 12.4 10.1*  PLT 143* 120*    Newborn Data Live born female  Birth Weight: 8 lb 8.7 oz (3875 g) APGAR: 9, 9  See operative report for further details  Home with mother.  Discharge Instructions:  Wound Care: keep clean and dry / remove honeycomb POD 5 Postpartum Instructions: [facility] discharge booklet - instructions reviewed Medications:    Medication List         ferrous sulfate 325 (65 FE) MG tablet  Take 1 tablet (325 mg total) by mouth daily.     ibuprofen 600 MG tablet  Commonly known as:  ADVIL,MOTRIN  Take 1 tablet (600 mg total) by mouth every 6 (six) hours.     oxyCODONE-acetaminophen 5-325 MG per tablet  Commonly known as:  PERCOCET/ROXICET  Take 1-2 tablets by mouth every 4 (four) hours as needed for severe pain (moderate - severe pain).     prenatal multivitamin Tabs tablet  Take 1 tablet by mouth daily at 12 noon.           Follow-up Information   [facility] A[facility]. Schedule an appointment as soon as possible for a visit in 6 weeks.   Specialty:  Obstetrics and Gynecology   Contact information:   [address] [place] [id number] [phone]         Signed: Lawernce PittsMelanie N Amariah Kierstead [facility] PaWHNP-BC, MSN 06/20/2013, 12:10 PM

## 2013-06-21 NOTE — Discharge Summary (Signed)
Reviewed and agree with note and plan. V.Tallan Sandoz, MD  

## 2013-10-25 ENCOUNTER — Encounter: Payer: Self-pay | Admitting: Obstetrics and Gynecology

## 2013-12-23 ENCOUNTER — Encounter (HOSPITAL_COMMUNITY): Payer: Self-pay | Admitting: Obstetrics & Gynecology

## 2013-12-25 ENCOUNTER — Encounter: Payer: BC Managed Care – PPO | Admitting: Obstetrics and Gynecology

## 2013-12-25 ENCOUNTER — Telehealth: Payer: Self-pay | Admitting: Obstetrics and Gynecology

## 2013-12-25 ENCOUNTER — Encounter: Payer: Self-pay | Admitting: Obstetrics and Gynecology

## 2013-12-25 NOTE — Telephone Encounter (Signed)
Pt arrived for her appointment today with her 6 month old baby. Please schedule this patient for a NGYN appointment with Dr. Silva only because she is from [place]. I told patient you would call her some time today with an appointment. °

## 2013-12-25 NOTE — Telephone Encounter (Signed)
Please call patient and schedule her with next available new gyn appointment with Dr. Edward JollySilva. I do not believe this is referral. Can have next open new gyn with Dr. Edward JollySilva. Thank you.

## 2013-12-25 NOTE — Telephone Encounter (Signed)
Pt calling back to schedule appointment with Dr Edward JollySilva.

## 2013-12-25 NOTE — Telephone Encounter (Signed)
Attempt to return call to patent, VM box not set up, unable to leave message.

## 2013-12-25 NOTE — Telephone Encounter (Signed)
Not needed

## 2013-12-25 NOTE — Telephone Encounter (Signed)
Pt arrived for her appointment today with her 856 month old baby. Please schedule this patient for a NGYN appointment with Dr. Edward JollySilva only because she is from [place]. I told patient you would call her some time today with an appointment.

## 2013-12-26 NOTE — Telephone Encounter (Signed)
Call to patient. Offered to reschedule appointment at a time when she would have child care for her baby. She states she can usually have her husband stay home with baby so will make those plans for next appointment. Has been 2 years since last check-up. Still breastfeeding and would like Dr Edward JollySilva to check breast but denies any current problems. NGYN appointment scheduled for 01-24-14 at 930. Patient agreeable.

## 2013-12-27 NOTE — Telephone Encounter (Signed)
Scheduled for 01/24/14 with Dr. Edward JollySilva by Kennon RoundsSally.

## 2014-01-24 ENCOUNTER — Ambulatory Visit (INDEPENDENT_AMBULATORY_CARE_PROVIDER_SITE_OTHER): Payer: BC Managed Care – PPO | Admitting: Obstetrics and Gynecology

## 2014-01-24 ENCOUNTER — Encounter: Payer: Self-pay | Admitting: Obstetrics and Gynecology

## 2014-01-24 VITALS — BP 98/60 | HR 70 | Resp 14 | Ht 59.0 in | Wt 108.2 lb

## 2014-01-24 DIAGNOSIS — Z01419 Encounter for gynecological examination (general) (routine) without abnormal findings: Secondary | ICD-10-CM

## 2014-01-24 DIAGNOSIS — Z0000 Encounter for general adult medical examination without abnormal findings: Secondary | ICD-10-CM

## 2014-01-24 LAB — COMPREHENSIVE METABOLIC PANEL
ALT: 14 U/L (ref 0–35)
AST: 20 U/L (ref 0–37)
Albumin: 4.5 g/dL (ref 3.5–5.2)
Alkaline Phosphatase: 77 U/L (ref 39–117)
BILIRUBIN TOTAL: 0.5 mg/dL (ref 0.2–1.2)
BUN: 18 mg/dL (ref 6–23)
CALCIUM: 9.5 mg/dL (ref 8.4–10.5)
CO2: 27 meq/L (ref 19–32)
CREATININE: 0.62 mg/dL (ref 0.50–1.10)
Chloride: 103 mEq/L (ref 96–112)
GLUCOSE: 73 mg/dL (ref 70–99)
Potassium: 4 mEq/L (ref 3.5–5.3)
Sodium: 139 mEq/L (ref 135–145)
Total Protein: 7.2 g/dL (ref 6.0–8.3)

## 2014-01-24 LAB — POCT URINALYSIS DIPSTICK
Bilirubin, UA: NEGATIVE
Blood, UA: NEGATIVE
Glucose, UA: NEGATIVE
Ketones, UA: NEGATIVE
Leukocytes, UA: NEGATIVE
Nitrite, UA: NEGATIVE
PH UA: 5
PROTEIN UA: NEGATIVE
UROBILINOGEN UA: NEGATIVE

## 2014-01-24 LAB — CBC
HEMATOCRIT: 42.6 % (ref 36.0–46.0)
HEMOGLOBIN: 14.3 g/dL (ref 12.0–15.0)
MCH: 29 pg (ref 26.0–34.0)
MCHC: 33.6 g/dL (ref 30.0–36.0)
MCV: 86.4 fL (ref 78.0–100.0)
MPV: 13.1 fL — AB (ref 9.4–12.4)
Platelets: 225 10*3/uL (ref 150–400)
RBC: 4.93 MIL/uL (ref 3.87–5.11)
RDW: 14 % (ref 11.5–15.5)
WBC: 7.2 10*3/uL (ref 4.0–10.5)

## 2014-01-24 NOTE — Progress Notes (Signed)
Patient ID: Norma Garcia, female   DOB: 18-May-1990, 23 y.o.   MRN: [id number] 23 y.o. G1P1001 MarriedCaucasianF here for annual exam.   PCP:  None  Had a Cesarean Section for failure to progress.  Dilated to 9.5 cm.  LiIttle boy weighted almost 4 Kg. No diabetes.   Had Mirena IUD placed 3 months post partum.  Placed under ultrasound guidance.  Feels more anxious and has increased acne.   Anxiety is diminishing.  Is not certain if this is premenstrual.  No depression.  Decreased libido.  No menses.  Thinking about a change in contraception.  Still breast feeding.   Going to [place] in one week.  Does not want to make any changes in contraception until after return from travel.   Patient's last menstrual period was 08/21/2012.          Sexually active: Yes.   female The current method of family planning is IUD.-Mirena inserted 07/2013.    Exercising: No.  none. Smoker:  no  Health Maintenance: Pap:  08/2012 wnl History of abnormal Pap:  no MMG:  --- Colonoscopy:  --- BMD:   --- TDaP:  2014 Screening Labs:   Hb today: 14.2, Urine today: neg   reports that she has never smoked. She does not have any smokeless tobacco history on file. She reports that she does not drink alcohol or use illicit drugs.  Past Medical History  Diagnosis Date  . Postpartum care following cesarean delivery (4/28) 06/19/2013  . 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 Cesarean section N/A 06/18/2013    Procedure: CESAREAN SECTION;  Surgeon: Genia DelMarie-Lyne Lavoie, MD;  Location: [facility];  Service: Obstetrics;  Laterality: N/A;    Current Outpatient Prescriptions  Medication Sig Dispense Refill  . Prenatal Vit-Fe Fumarate-FA (PRENATAL MULTIVITAMIN) TABS tablet Take 1 tablet by mouth daily at 12 noon.     No current facility-administered medications for this visit.    Family History  Problem Relation Age of Onset  . Hyperlipidemia Father   .  Diabetes Paternal Grandmother     ROS:  Pertinent items are noted in HPI.  Otherwise, a comprehensive ROS was negative.  Exam:   BP 98/60 mmHg  Pulse 70  Resp 14  Ht 4\' 11"  (1.499 m)  Wt 108 lb 3.2 oz (49.079 kg)  BMI 21.84 kg/m2  LMP 08/21/2012     Height: 4\' 11"  (149.9 cm)  Ht Readings from Last 3 Encounters:  01/24/14 4\' 11"  (1.499 m)  06/19/13 5' 0.25" (1.53 m)  06/18/13 5' 0.25" (1.53 m)    General appearance: alert, cooperative and appears stated age Head: Normocephalic, without obvious abnormality, atraumatic Neck: no adenopathy, supple, symmetrical, trachea midline and thyroid normal to inspection and palpation Lungs: clear to auscultation bilaterally Breasts: normal appearance, no masses or tenderness, Inspection negative, No nipple retraction or dimpling, No nipple discharge or bleeding Heart: regular rate and rhythm Abdomen: soft, non-tender; bowel sounds normal; no masses,  no organomegaly Extremities: extremities normal, atraumatic, no cyanosis or edema Skin: Skin color, texture, turgor normal. No rashes or lesions Lymph nodes: Cervical, supraclavicular, and axillary nodes normal. No abnormal inguinal nodes palpated Neurologic: Grossly normal   Pelvic: External genitalia:  no lesions              Urethra:  normal appearing urethra with no masses, tenderness or lesions              Bartholins and Skenes: normal  Vagina: normal appearing vagina with normal color and discharge, no lesions              Cervix: no lesions and IUD strings noted.              Pap taken: No. Bimanual Exam:  Uterus:  normal size, contour, position, consistency, mobility, non-tender              Adnexa: normal adnexa and no mass, fullness, tenderness               A:  Well Woman with normal exam Mirena IUD patient.  History of Cesarean Section.   P:   pap smear in 2017. counseled on breast self exam CBC, CMP, Vit D. Discussed ParaGard IUD.  Will consider.  return  annually or prn  An After Visit Summary was printed and given to the patient.

## 2014-01-24 NOTE — Patient Instructions (Signed)
Intrauterine Device Information An intrauterine device (IUD) is inserted into your uterus to prevent pregnancy. There are two types of IUDs available:   Copper IUD--This type of IUD is wrapped in copper wire and is placed inside the uterus. Copper makes the uterus and fallopian tubes produce a fluid that kills sperm. The copper IUD can stay in place for 10 years.  Hormone IUD--This type of IUD contains the hormone progestin (synthetic progesterone). The hormone thickens the cervical mucus and prevents sperm from entering the uterus. It also thins the uterine lining to prevent implantation of a fertilized egg. The hormone can weaken or kill the sperm that get into the uterus. One type of hormone IUD can stay in place for 5 years, and another type can stay in place for 3 years. Your health care provider will make sure you are a good candidate for a contraceptive IUD. Discuss with your health care provider the possible side effects.  ADVANTAGES OF AN INTRAUTERINE DEVICE  IUDs are highly effective, reversible, long acting, and low maintenance.   There are no estrogen-related side effects.   An IUD can be used when breastfeeding.   IUDs are not associated with weight gain.   The copper IUD works immediately after insertion.   The hormone IUD works right away if inserted within 7 days of your period starting. You will need to use a backup method of birth control for 7 days if the hormone IUD is inserted at any other time in your cycle.  The copper IUD does not interfere with your female hormones.   The hormone IUD can make heavy menstrual periods lighter and decrease cramping.   The hormone IUD can be used for 3 or 5 years.   The copper IUD can be used for 10 years. DISADVANTAGES OF AN INTRAUTERINE DEVICE  The hormone IUD can be associated with irregular bleeding patterns.   The copper IUD can make your menstrual flow heavier and more painful.   You may experience cramping and  vaginal bleeding after insertion.  Document Released: 01/12/2004 Document Revised: 10/10/2012 Document Reviewed: 07/29/2012 ExitCare Patient Information 2015 [employer]. This information is not intended to replace advice given to you by your health care provider. Make sure you discuss any questions you have with your health care provider.  

## 2014-01-25 LAB — VITAMIN D 25 HYDROXY (VIT D DEFICIENCY, FRACTURES): Vit D, 25-Hydroxy: 33 ng/mL (ref 30–100)

## 2014-01-27 LAB — HEMOGLOBIN, FINGERSTICK: Hemoglobin, fingerstick: 14.2 g/dL (ref 12.0–16.0)

## 2014-09-02 LAB — HM PAP SMEAR

## 2014-11-07 LAB — OB RESULTS CONSOLE HIV ANTIBODY (ROUTINE TESTING): HIV: NONREACTIVE

## 2014-11-07 LAB — OB RESULTS CONSOLE RUBELLA ANTIBODY, IGM: Rubella: IMMUNE

## 2014-11-07 LAB — OB RESULTS CONSOLE RPR: RPR: NONREACTIVE

## 2014-11-07 LAB — OB RESULTS CONSOLE ABO/RH: RH Type: POSITIVE

## 2014-11-07 LAB — OB RESULTS CONSOLE ANTIBODY SCREEN: ANTIBODY SCREEN: NEGATIVE

## 2014-11-07 LAB — OB RESULTS CONSOLE HEPATITIS B SURFACE ANTIGEN: Hepatitis B Surface Ag: NEGATIVE

## 2014-11-14 LAB — OB RESULTS CONSOLE GC/CHLAMYDIA
Chlamydia: NEGATIVE
GC PROBE AMP, GENITAL: NEGATIVE

## 2015-02-22 NOTE — L&D Delivery Note (Signed)
Delivery Note  First Stage: Labor onset: 0230 Augmentation : none Analgesia /Anesthesia intrapartum: none - 1 attempt of nitrous oxide, but pt declined use after 1 attempt SROM at 0548  Second Stage: Complete dilation at 0613 Onset of pushing at 0615 FHR second stage 150  Delivery of a viable female at [address] by CNM in ROA position Loose nuchal cord x 1 Cord double clamped after cessation of pulsation, cut by doula Cord blood sample collected   Third Stage: Placenta delivered via Tomasa BlaseSchultz intact with 3 VC @ [phone] Placenta disposition: L&D Uterine tone firm / bleeding moderate  2nd vaginal/perineal laceration identified  Anesthesia for repair: 1% Lidocaine Repair 2-0, 3-0, 4-0 vicryl Est. Blood Loss (mL): 350  Complications: none  Mom to postpartum.  Baby to Couplet care / Skin to Skin.  Newborn: Birth Weight: 8 lbs 12.6 oz  Apgar Scores: 6/9 Feeding planned: breast  Raelyn MoraAWSON, Koen Antilla, M  MSN, CNM 06/18/2015, 9:27 AM

## 2015-05-18 LAB — OB RESULTS CONSOLE GBS: STREP GROUP B AG: NEGATIVE

## 2015-05-26 ENCOUNTER — Encounter (HOSPITAL_COMMUNITY): Payer: Self-pay | Admitting: *Deleted

## 2015-05-26 ENCOUNTER — Telehealth (HOSPITAL_COMMUNITY): Payer: Self-pay | Admitting: *Deleted

## 2015-05-26 NOTE — Telephone Encounter (Signed)
Interpreter number [phone]

## 2015-05-26 NOTE — Telephone Encounter (Signed)
Preadmission screen  

## 2015-06-01 ENCOUNTER — Observation Stay (HOSPITAL_COMMUNITY): Payer: BLUE CROSS/BLUE SHIELD

## 2015-06-01 ENCOUNTER — Encounter (HOSPITAL_COMMUNITY): Payer: Self-pay

## 2015-06-01 ENCOUNTER — Observation Stay (HOSPITAL_COMMUNITY)
Admission: RE | Admit: 2015-06-01 | Discharge: 2015-06-01 | Disposition: A | Discharge status: Home / Self Care | Payer: BLUE CROSS/BLUE SHIELD | Source: Ambulatory Visit | Attending: Obstetrics and Gynecology | Admitting: Obstetrics and Gynecology

## 2015-06-01 VITALS — BP 92/53 | HR 89 | Temp 98.3°F | Resp 18 | Ht <= 58 in | Wt 141.0 lb

## 2015-06-01 DIAGNOSIS — O288 Other abnormal findings on antenatal screening of mother: Secondary | ICD-10-CM

## 2015-06-01 DIAGNOSIS — F419 Anxiety disorder, unspecified: Secondary | ICD-10-CM | POA: Insufficient documentation

## 2015-06-01 DIAGNOSIS — O471 False labor at or after 37 completed weeks of gestation: Secondary | ICD-10-CM | POA: Insufficient documentation

## 2015-06-01 DIAGNOSIS — O34219 Maternal care for unspecified type scar from previous cesarean delivery: Secondary | ICD-10-CM | POA: Diagnosis not present

## 2015-06-01 DIAGNOSIS — O99343 Other mental disorders complicating pregnancy, third trimester: Secondary | ICD-10-CM | POA: Diagnosis not present

## 2015-06-01 DIAGNOSIS — Z3A37 37 weeks gestation of pregnancy: Secondary | ICD-10-CM

## 2015-06-01 DIAGNOSIS — O321XX0 Maternal care for breech presentation, not applicable or unspecified: Secondary | ICD-10-CM | POA: Diagnosis not present

## 2015-06-01 DIAGNOSIS — Z3A38 38 weeks gestation of pregnancy: Secondary | ICD-10-CM | POA: Diagnosis not present

## 2015-06-01 MED ORDER — CITRIC ACID-SODIUM CITRATE 334-500 MG/5ML PO SOLN
ORAL | Status: AC
  Filled 2015-06-01: qty 15

## 2015-06-01 MED ORDER — TERBUTALINE SULFATE 1 MG/ML IJ SOLN
INTRAMUSCULAR | Status: AC
  Filled 2015-06-01: qty 1

## 2015-06-01 MED ORDER — LACTATED RINGERS IV SOLN
INTRAVENOUS | Status: DC
  Administered 2015-06-01: 10:00:00 via INTRAVENOUS

## 2015-06-01 NOTE — H&P (Signed)
Norma Garcia is a 25 y.o. female presenting for ECV due to frank Garcia presentation.  Maternal Medical History:  Contractions: Onset was less than 1 hour ago.   Frequency: rare.    Fetal activity: Perceived fetal activity is normal.   Last perceived fetal movement was greater than 24 hours ago.    Prenatal Complications - Diabetes: none.    OB History    Gravida Para Term Preterm AB TAB SAB Ectopic Multiple Living   2 1 1  0 0 0 0 0 0 1     Past Medical History  Diagnosis Date  . Postpartum care following cesarean delivery (4/28) 06/19/2013  . 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 Keloid   . Anxiety   . Headache     occasional migraines during second trimester; none during third trimester   Past Surgical History  Procedure Laterality Date  . Cesarean section N/A 06/18/2013    Procedure: CESAREAN SECTION;  Surgeon: Genia DelMarie-Lyne Lavoie, MD;  Location: [facility];  Service: Obstetrics;  Laterality: N/A;   Family History: family history includes Diabetes in her paternal grandmother; Hyperlipidemia in her father. Social History:  reports that she has never smoked. She has never used smokeless tobacco. She reports that she does not drink alcohol or use illicit drugs.   Prenatal Transfer Tool  Maternal Diabetes: No Genetic Screening: Normal Maternal Ultrasounds/Referrals: Normal Fetal Ultrasounds or other Referrals:  None Maternal Substance Abuse:  No Significant Maternal Medications:  None Significant Maternal Lab Results:  None Other Comments:  None  Review of Systems  Constitutional: Negative.   All other systems reviewed and are negative.     Blood pressure 119/74, pulse 93, temperature 98.3 F (36.8 C), temperature source Oral, resp. rate 20, height 4\' 9"  (1.448 m), weight 63.957 kg (141 lb), last menstrual period 09/04/2014, unknown if currently breastfeeding. Maternal Exam:  Uterine Assessment: Contraction strength is mild.  Contraction frequency is rare.    Abdomen: Patient reports no abdominal tenderness. Surgical scars: low transverse.   Fetal presentation: Garcia  Introitus: Normal vulva. Normal vagina.  Ferning test: not done.  Nitrazine test: not done. Amniotic fluid character: not assessed.  Pelvis: adequate for delivery.      Physical Exam  Nursing note and vitals reviewed. Constitutional: She is oriented to person, place, and time. She appears well-developed and well-nourished.  HENT:  Head: Normocephalic and atraumatic.  Neck: Normal range of motion. Neck supple.  Cardiovascular: Normal rate and regular rhythm.   Respiratory: Effort normal and breath sounds normal.  GI: Soft. Bowel sounds are normal.  Genitourinary: Vagina normal and uterus normal.  Musculoskeletal: Normal range of motion.  Neurological: She is alert and oriented to person, place, and time.  Skin: Skin is warm and dry.  Psychiatric: She has a normal mood and affect.    Prenatal labs: ABO, Rh: O/Positive/-- (09/16 0000) Antibody: Negative (09/16 0000) Rubella: Immune (09/16 0000) RPR: Nonreactive (09/16 0000)  HBsAg: Negative (09/16 0000)  HIV: Non-reactive (09/16 0000)  GBS: Negative (03/27 0000)   Assessment/Plan: 37 wk iup Norma Garcia for ECV Consent done.   Norma Garcia 06/01/2015, 8:58 AM

## 2015-06-01 NOTE — Progress Notes (Signed)
No complaints. No pain BP 119/74 mmHg  Pulse 93  Temp(Src) 98.3 F (36.8 C) (Oral)  Resp 20  Ht 4\' 9"  (1.448 m)  Wt 63.957 kg (141 lb)  BMI 30.50 kg/m2  LMP 09/04/2014  Category 1 tracing No decels noted, rare contractions Will dc home

## 2015-06-01 NOTE — Progress Notes (Signed)
Patient ID: Norma Garcia, female   DOB: [birth year], 25 y.o.   MRN: [id number] Category 1 tracing. BP 119/74 mmHg  Pulse 93  Temp(Src) 98.3 F (36.8 C) (Oral)  Resp 20  Ht 4\' 9"  (1.448 m)  Wt 63.957 kg (141 lb)  BMI 30.50 kg/m2  LMP 09/04/2014  Consent done No contractions noted, declines Terbutaline Sono confirms frank breech ECV attempted forward roll without complications with version to vertex confirmed by sono Pt tolerated procedure well Fetal HR recovers to 120s immediately post procedure.  Will continue to monitor.

## 2015-06-01 NOTE — OB Triage Note (Signed)
Pt verbalizes understanding 

## 2015-06-01 NOTE — Progress Notes (Signed)
S:  here for external cephalic version - breech       understands procedure and aware of risks and benefits       previous CS - strong desire for TOLAC  O:  VS: Blood pressure 119/74, pulse 93, temperature 98.3 F (36.8 C), temperature source Oral, resp. rate 20, height 4\' 9"  (1.448 m), weight 63.957 kg (141 lb), last menstrual period 09/04/2014, unknown if currently breastfeeding.        FHR : baseline 140 / variability moderate / accelerations + / no decelerations        Toco: no ctx during initial monitoring        Sono at bedside: singleton / frank breech / RST / subjectively low normal AFI / + cardiac activity / + breathing motions visible with cardiac assessment  A: Breech presentation     FHR category 1     previous CS - desires TOLAC  P: attempt ECV     Dr Billy Coastaavon en route     Consent and patient prepration by nursing staff     Norma Garcia, Norma Garcia CNM, MSN, [facility] 06/01/2015, 8:18 AM

## 2015-06-18 ENCOUNTER — Inpatient Hospital Stay (HOSPITAL_COMMUNITY): Admission: AD | Admit: 2015-06-18 | Payer: Self-pay | Source: Ambulatory Visit | Admitting: Obstetrics and Gynecology

## 2015-06-18 ENCOUNTER — Inpatient Hospital Stay (HOSPITAL_COMMUNITY)
Admission: AD | Admit: 2015-06-18 | Discharge: 2015-06-19 | DRG: 775 | Disposition: A | Discharge status: Home / Self Care | Payer: BLUE CROSS/BLUE SHIELD | Source: Ambulatory Visit | Attending: Obstetrics & Gynecology | Admitting: Obstetrics & Gynecology

## 2015-06-18 ENCOUNTER — Encounter (HOSPITAL_COMMUNITY): Payer: Self-pay | Admitting: Obstetrics and Gynecology

## 2015-06-18 DIAGNOSIS — Z3A40 40 weeks gestation of pregnancy: Secondary | ICD-10-CM

## 2015-06-18 DIAGNOSIS — IMO0002 Reserved for concepts with insufficient information to code with codable children: Secondary | ICD-10-CM | POA: Diagnosis present

## 2015-06-18 DIAGNOSIS — O34211 Maternal care for low transverse scar from previous cesarean delivery: Secondary | ICD-10-CM | POA: Diagnosis present

## 2015-06-18 DIAGNOSIS — O34219 Maternal care for unspecified type scar from previous cesarean delivery: Secondary | ICD-10-CM

## 2015-06-18 DIAGNOSIS — Z833 Family history of diabetes mellitus: Secondary | ICD-10-CM | POA: Diagnosis not present

## 2015-06-18 LAB — CBC
HCT: 37.3 % (ref 36.0–46.0)
HEMOGLOBIN: 12.7 g/dL (ref 12.0–15.0)
MCH: 29 pg (ref 26.0–34.0)
MCHC: 34 g/dL (ref 30.0–36.0)
MCV: 85.2 fL (ref 78.0–100.0)
Platelets: 146 10*3/uL — ABNORMAL LOW (ref 150–400)
RBC: 4.38 MIL/uL (ref 3.87–5.11)
RDW: 13.8 % (ref 11.5–15.5)
WBC: 13.9 10*3/uL — ABNORMAL HIGH (ref 4.0–10.5)

## 2015-06-18 LAB — RPR: RPR Ser Ql: NONREACTIVE

## 2015-06-18 LAB — TYPE AND SCREEN
ABO/RH(D): O POS
ANTIBODY SCREEN: NEGATIVE

## 2015-06-18 MED ORDER — IBUPROFEN 600 MG PO TABS
600.0000 mg | ORAL_TABLET | Freq: Four times a day (QID) | ORAL | Status: DC
  Administered 2015-06-18: 600 mg via ORAL
  Filled 2015-06-18: qty 1

## 2015-06-18 MED ORDER — COCONUT OIL OIL
1.0000 | TOPICAL_OIL | Status: DC | PRN
Start: 2015-06-18 — End: 2015-06-19

## 2015-06-18 MED ORDER — ACETAMINOPHEN 325 MG PO TABS: 650.0000 mg | ORAL_TABLET | ORAL | Status: DC | PRN

## 2015-06-18 MED ORDER — OXYTOCIN 10 UNIT/ML IJ SOLN
INTRAMUSCULAR | Status: AC
  Filled 2015-06-18: qty 1

## 2015-06-18 MED ORDER — LACTATED RINGERS IV SOLN: INTRAVENOUS | Status: DC

## 2015-06-18 MED ORDER — LACTATED RINGERS IV SOLN: 500.0000 mL | INTRAVENOUS | Status: DC | PRN

## 2015-06-18 MED ORDER — IBUPROFEN 600 MG PO TABS
600.0000 mg | ORAL_TABLET | Freq: Four times a day (QID) | ORAL | Status: DC
  Administered 2015-06-18 – 2015-06-19 (×3): 600 mg via ORAL
  Filled 2015-06-18 (×5): qty 1

## 2015-06-18 MED ORDER — DIPHENHYDRAMINE HCL 25 MG PO CAPS: 25.0000 mg | ORAL_CAPSULE | Freq: Four times a day (QID) | ORAL | Status: DC | PRN

## 2015-06-18 MED ORDER — FLEET ENEMA 7-19 GM/118ML RE ENEM: 1.0000 | ENEMA | RECTAL | Status: DC | PRN

## 2015-06-18 MED ORDER — HYDROCORTISONE ACE-PRAMOXINE 1-1 % RE FOAM
1.0000 | Freq: Two times a day (BID) | RECTAL | Status: DC
  Administered 2015-06-18: 1 via RECTAL
  Filled 2015-06-18: qty 10

## 2015-06-18 MED ORDER — OXYCODONE-ACETAMINOPHEN 5-325 MG PO TABS: 1.0000 | ORAL_TABLET | ORAL | Status: DC | PRN

## 2015-06-18 MED ORDER — ZOLPIDEM TARTRATE 5 MG PO TABS
5.0000 mg | ORAL_TABLET | Freq: Every evening | ORAL | Status: DC | PRN
Start: 2015-06-18 — End: 2015-06-19

## 2015-06-18 MED ORDER — SIMETHICONE 80 MG PO CHEW: 80.0000 mg | CHEWABLE_TABLET | ORAL | Status: DC | PRN

## 2015-06-18 MED ORDER — OXYTOCIN BOLUS FROM INFUSION
500.0000 mL | INTRAVENOUS | Status: DC
  Administered 2015-06-18: 500 mL via INTRAVENOUS

## 2015-06-18 MED ORDER — ONDANSETRON HCL 4 MG PO TABS: 4.0000 mg | ORAL_TABLET | ORAL | Status: DC | PRN

## 2015-06-18 MED ORDER — OXYCODONE-ACETAMINOPHEN 5-325 MG PO TABS: 2.0000 | ORAL_TABLET | ORAL | Status: DC | PRN

## 2015-06-18 MED ORDER — CITRIC ACID-SODIUM CITRATE 334-500 MG/5ML PO SOLN: 30.0000 mL | ORAL | Status: DC | PRN

## 2015-06-18 MED ORDER — OXYTOCIN 10 UNIT/ML IJ SOLN
2.5000 [IU]/h | INTRAVENOUS | Status: DC
  Filled 2015-06-18: qty 4

## 2015-06-18 MED ORDER — WITCH HAZEL-GLYCERIN EX PADS
1.0000 "application " | MEDICATED_PAD | CUTANEOUS | Status: DC | PRN
  Administered 2015-06-18: 1 via TOPICAL

## 2015-06-18 MED ORDER — LIDOCAINE HCL (PF) 1 % IJ SOLN
30.0000 mL | INTRAMUSCULAR | Status: DC | PRN
  Administered 2015-06-18: 30 mL via SUBCUTANEOUS
  Filled 2015-06-18: qty 30

## 2015-06-18 MED ORDER — BENZOCAINE-MENTHOL 20-0.5 % EX AERO
1.0000 "application " | INHALATION_SPRAY | CUTANEOUS | Status: DC | PRN
  Administered 2015-06-18: 1 via TOPICAL
  Filled 2015-06-18: qty 56

## 2015-06-18 MED ORDER — TETANUS-DIPHTH-ACELL PERTUSSIS 5-2.5-18.5 LF-MCG/0.5 IM SUSP
0.5000 mL | Freq: Once | INTRAMUSCULAR | Status: DC
Start: 2015-06-19 — End: 2015-06-19

## 2015-06-18 MED ORDER — ONDANSETRON HCL 4 MG/2ML IJ SOLN: 4.0000 mg | Freq: Four times a day (QID) | INTRAMUSCULAR | Status: DC | PRN

## 2015-06-18 MED ORDER — SENNOSIDES-DOCUSATE SODIUM 8.6-50 MG PO TABS
2.0000 | ORAL_TABLET | ORAL | Status: DC
  Administered 2015-06-18: 2 via ORAL
  Filled 2015-06-18: qty 2

## 2015-06-18 MED ORDER — PRENATAL MULTIVITAMIN CH
1.0000 | ORAL_TABLET | Freq: Every day | ORAL | Status: DC
  Administered 2015-06-18 – 2015-06-19 (×2): 1 via ORAL
  Filled 2015-06-18 (×2): qty 1

## 2015-06-18 MED ORDER — ONDANSETRON HCL 4 MG/2ML IJ SOLN: 4.0000 mg | INTRAMUSCULAR | Status: DC | PRN

## 2015-06-18 MED ORDER — DIBUCAINE 1 % RE OINT: 1.0000 "application " | TOPICAL_OINTMENT | RECTAL | Status: DC | PRN

## 2015-06-18 MED ORDER — LIDOCAINE HCL (PF) 1 % IJ SOLN
INTRAMUSCULAR | Status: AC
  Administered 2015-06-18: 30 mL via SUBCUTANEOUS
  Filled 2015-06-18: qty 30

## 2015-06-18 NOTE — Progress Notes (Signed)
S:  Pushing with ctx since 0645  O:  VS: Temperature 98.3 F (36.8 C), temperature source Oral, height 4' 11.06" (1.5 m), weight 65.772 kg (145 lb), last menstrual period 09/04/2014, unknown if currently breastfeeding.        FHR : baseline 150 / variability moderate / accelerations + / few mild variable decelerations        Toco: contractions every 2 minutes / strong         vtx crowning        Pushing variety of positions with continuous labor support - spouse,nurse, doula,CNM  A: active labor - second stage      TOLAC     FHR category 2  P: anticipate SVB - VBAC       Norma Garcia, Norma Garcia CNM, MSN, FACNM 06/18/2015, 8:16 AM

## 2015-06-18 NOTE — Lactation Note (Signed)
This note was copied from a baby's chart. Lactation Consultation Note  Patient Name: Norma Maricela Bondressa Jacovozzi ZHYQM'VToday's Date: 06/18/2015 Reason for consult: Initial assessment   Initial consult with Exp Bf mom of 9 hour old infant. Infant with 3 BF for 15-20 minutes and 1 void since birth. LATCH Scores 8 by bedside RN.  Mom reports she feels BF is going well and that infant is latching well with flanged lips. She denies nipple pain or tenderness. Enc mom to BF infant 8-12 x in 24 hours at first feeding cues. Infant currently asleep STS with mom.   LC Brochure and BF Resources Handout given. Informed mom of OP Services, BF Support Groups and LC Phone #. Mom without questions at this time, enc her to call with questions/concerns prn.   Maternal Data Formula Feeding for Exclusion: No Does the patient have breastfeeding experience prior to this delivery?: Yes  Feeding Feeding Type: Breast Fed Length of feed: 15 min  LATCH Score/Interventions                      Lactation Tools Discussed/Used WIC Program: No   Consult Status Consult Status: Follow-up Date: 06/19/15 Follow-up type: In-patient    Norma Garcia 06/18/2015, 5:55 PM

## 2015-06-18 NOTE — H&P (Signed)
  OB ADMISSION/ HISTORY & PHYSICAL:  Admission Date: 06/18/2015  5:22 AM  Admit Diagnosis: 40 weeks / active labor / previous Cesarean section -desires TOLAC  Norma Garcia is a 25 y.o. female presenting for onset of labor at [id number].  Prenatal History: G2P1001   EDC : 06/18/2015, by Other Basis  [facility]  Primary Ob Provider: Seymour BarsLavoie - CNM care [facility] Prenatal course complicated by previous cesarean section - arrest of labor / breech with successful ECV current pregnancy  Prenatal Labs: ABO, Rh: --/--/O POS (04/27 0540) Antibody: NEG (04/27 0540) Rubella: Immune (09/16 0000)  RPR: Nonreactive (09/16 0000)  HBsAg: Negative (09/16 0000)  HIV: Non-reactive (09/16 0000)  GTT: nl GBS: Negative (03/27 0000)   Medical / Surgical History :  Past medical history:  Past Medical History  Diagnosis Date  . Postpartum care following cesarean delivery (4/28) 06/19/2013  . 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 Keloid   . Anxiety   . Headache     occasional migraines during second trimester; none during third trimester     Past surgical history:  Past Surgical History  Procedure Laterality Date  . Cesarean section N/A 06/18/2013    Procedure: CESAREAN SECTION;  Surgeon: Genia DelMarie-Lyne Lavoie, MD;  Location: [facility];  Service: Obstetrics;  Laterality: N/A;    Family History:  Family History  Problem Relation Age of Onset  . Hyperlipidemia Father   . Diabetes Paternal Grandmother     Social History:  reports that she has never smoked. She has never used smokeless tobacco. She reports that she does not drink alcohol or use illicit drugs.  Allergies: Reglan   Current Medications at time of admission:  Prior to Admission medications   Medication Sig Start Date End Date Taking? Authorizing Provider  Prenatal Vit-Fe Fumarate-FA (PRENATAL MULTIVITAMIN) TABS tablet Take 1 tablet by mouth daily at 12 noon.    Historical Provider, MD   Review of  Systems: Active FM onset of ctx @ 0230 currently every 2 minutes SROM - light MSF bloody show present  Physical Exam:  VS: Temperature 98.3 F (36.8 C), temperature source Oral, height 4' 11.06" (1.5 m), weight 65.772 kg (145 lb), last menstrual period 09/04/2014, unknown if currently breastfeeding.  General: alert and oriented, appears uncomfortable Heart: RRR Lungs: Clear lung fields Abdomen: Gravid, soft and non-tender, non-distended / uterus: gravid Extremities: 1+ pedal edema  Genitalia / VE: Dilation: 10 Effacement (%): 100 Station: -1 Exam by:: Stone Spirito CNM  FHR: baseline rate 150 / variability moderate / accelerations + / no decelerations TOCO: Q 2 minutes  Assessment: [redacted] weeks gestation Previous CS desires TOLAC / natural childbirth plan active stage of labor FHR category 1   Plan:  Admit Expectant management for TOLAC  Dr Seymour BarsLavoie notified of admission    Marlinda MikeBAILEY, Brooklee Michelin CNM, MSN, [facility] 06/18/2015,0615am

## 2015-06-18 NOTE — MAU Note (Signed)
Pt here with contractions, leaking fluid, reports positive fetal movement.

## 2015-06-19 LAB — CBC
HEMATOCRIT: 26.9 % — AB (ref 36.0–46.0)
HEMOGLOBIN: 9.1 g/dL — AB (ref 12.0–15.0)
MCH: 29 pg (ref 26.0–34.0)
MCHC: 34.2 g/dL (ref 30.0–36.0)
MCV: 84.9 fL (ref 78.0–100.0)
Platelets: 124 10*3/uL — ABNORMAL LOW (ref 150–400)
RBC: 3.17 MIL/uL — ABNORMAL LOW (ref 3.87–5.11)
RDW: 13.9 % (ref 11.5–15.5)
WBC: 17 10*3/uL — AB (ref 4.0–10.5)

## 2015-06-19 MED ORDER — HYDROCORTISONE ACETATE 25 MG RE SUPP: 25.0000 mg | Freq: Two times a day (BID) | RECTAL | Status: AC

## 2015-06-19 MED ORDER — IBUPROFEN 600 MG PO TABS: 600.0000 mg | ORAL_TABLET | Freq: Four times a day (QID) | ORAL | Status: DC

## 2015-06-19 NOTE — Progress Notes (Signed)
CSW briefly met with MOB and FOB due to MOB's medical records documenting a history of adjustment disorder/anxiety secondary to immigration and transition to motherhood.  CSW introduced self and reason for the visit. MOB was in a pleasant mood, displayed a full range in affect.  Per MOB and FOB, MOB has never been formally diagnosed with anxiety, and denied belief that she has an acute history of anxiety. MOB described normative range in emotions secondary to being a mother, and denied belief that anxiety negatively impacts her sleep or her ability to engage in activities of daily living.  MOB denied mental health complications or concerns during this pregnancy or as she prepares for discharge home with this infant. She stated that she feels "happy" and "excited".  MOB and FOB were receptive and engaged during education on the baby blues and perinatal mood and anxiety disorders, and agreed to follow up with MOB's medical provider if concerns or needs arise.  

## 2015-06-19 NOTE — Discharge Summary (Signed)
Obstetric Discharge Summary  Reason for Admission: onset of labor Prenatal Procedures: none Intrapartum Procedures: spontaneous vaginal delivery Postpartum Procedures: none Complications-Operative and Postpartum: 2nd degree perineal laceration HEMOGLOBIN  Date Value Ref Range Status  06/19/2015 9.1* 12.0 - 15.0 g/dL Final    Comment:    DELTA CHECK NOTED REPEATED TO VERIFY    HEMOGLOBIN, FINGERSTICK  Date Value Ref Range Status  01/24/2014 14.2 12.0 - 16.0 g/dL Final   HCT  Date Value Ref Range Status  06/19/2015 26.9* 36.0 - 46.0 % Final    Physical Exam:  General: alert, cooperative and no distress Lochia: appropriate Uterine Fundus: firm Incision: healing well DVT Evaluation: No evidence of DVT seen on physical exam.  Discharge Diagnoses: Term Pregnancy-delivered  Discharge Information: Date: 06/19/2015 Activity: pelvic rest Diet: routine Medications: PNV, Ibuprofen and Anusol HC Condition: stable Instructions: refer to practice specific booklet Discharge to: home Follow-up Information    Follow up with Norma Garcia, Norma Garcia, CNM. Schedule an appointment as soon as possible for a visit in 6 weeks.   Specialty:  Obstetrics and Gynecology   Contact information:   [address] [place] [address] [phone]       Newborn Data: Live born female  Birth Weight: 8 lb 12.6 oz (3985 g) APGAR: 6, 9  Home with mother.  Norma Garcia, Norma Garcia 06/19/2015, 1:57 PM

## 2015-06-19 NOTE — Progress Notes (Signed)
PPD 1 SVD - VBAC  S:  Reports feeling well             Tolerating po/ No nausea or vomiting             Bleeding is light             Pain controlled with motrin              Up ad lib / ambulatory / voiding QS  Newborn breast feeding   O:               VS: BP 98/58 mmHg  Pulse 98  Temp(Src) 98.2 F (36.8 C) (Oral)  Resp 18  Ht 4' 11.06" (1.5 m)  Wt 65.772 kg (145 lb)  BMI 29.23 kg/m2  LMP 09/04/2014  Breastfeeding? Unknown   LABS:              Recent Labs  06/18/15 0540 06/19/15 0555  WBC 13.9* 17.0*  HGB 12.7 9.1*  PLT 146* 124*               Blood type: --/--/O POS (04/27 0540)  Rubella: Immune (09/16 0000)                     I&O: Intake/Output      04/27 0701 - 04/28 0700 04/28 0701 - 04/29 0700   Urine (mL/kg/hr) 0 (0)    Blood 500 (0.3)    Total Output 500     Net -500          Urine Occurrence 2 x                  Physical Exam:             Alert and oriented X3  Abdomen: soft, non-tender, non-distended              Fundus: firm, non-tender, U-1  Perineum: intact  Lochia: ligth  Extremities: trace edema, no calf pain or tenderness    A: PPD # 1 VBAC   Doing well - stable status  P: Routine post partum orders  Dc home - WOB booklet - instructions reviewed  Norma MikeBAILEY, Norma Garcia CNM, MSN, FACNM 06/19/2015, 1:07 PM

## 2015-06-19 NOTE — Lactation Note (Signed)
This note was copied from a baby's chart. Lactation Consultation Note  Patient Name: Norma Garcia EXBMW'UToday's Date: 06/19/2015 Reason for consult: Follow-up assessment   Follow up with Exp BF mom of 26 hour old infant. Infant with 9 BF for 10-30 minutes, 2 voids and 1 stool since birth. Infant weight 8 lb 9.6 oz with 2% weight loss since birth.   Mom denies breast changes since delivery, nipple tenderness, questions, or concerns.   Reviewed all BF information in Taking Care of Baby and Me Booklet. Reviewed Engorgement prevention/treatment, comfort pumping, and pre pumping. Reviewed I/O and enc mom to maintain feeding log and take to Ped appt. Infant with follow up Ped appt Sunday.  Reviewed LC Brochure, mom aware of OP Services, LC Phone # and BF Support Groups. Enc mom to call with questions/concerns prn.   Maternal Data Formula Feeding for Exclusion: No Does the patient have breastfeeding experience prior to this delivery?: Yes  Feeding Feeding Type: Breast Fed  LATCH Score/Interventions Latch: Grasps breast easily, tongue down, lips flanged, rhythmical sucking.  Audible Swallowing: None  Type of Nipple: Everted at rest and after stimulation  Comfort (Breast/Nipple): Soft / non-tender     Hold (Positioning): No assistance needed to correctly position infant at breast.  LATCH Score: 8  Lactation Tools Discussed/Used WIC Program: No Pump Review: Milk Storage   Consult Status Consult Status: Complete Follow-up type: Call as needed    Norma BlalockSharon S [name] 06/19/2015, 11:07 AM

## 2016-08-08 DIAGNOSIS — Z6822 Body mass index (BMI) 22.0-22.9, adult: Secondary | ICD-10-CM | POA: Diagnosis not present

## 2016-08-08 DIAGNOSIS — Z01419 Encounter for gynecological examination (general) (routine) without abnormal findings: Secondary | ICD-10-CM | POA: Diagnosis not present

## 2016-08-08 DIAGNOSIS — Z1321 Encounter for screening for nutritional disorder: Secondary | ICD-10-CM | POA: Diagnosis not present

## 2017-01-04 ENCOUNTER — Encounter: Payer: Self-pay | Admitting: Physician Assistant

## 2017-01-04 ENCOUNTER — Ambulatory Visit (INDEPENDENT_AMBULATORY_CARE_PROVIDER_SITE_OTHER): Payer: Self-pay | Admitting: Physician Assistant

## 2017-01-04 VITALS — BP 108/76 | HR 64 | Temp 97.7°F | Ht 60.0 in | Wt 118.2 lb

## 2017-01-04 DIAGNOSIS — R300 Dysuria: Secondary | ICD-10-CM | POA: Diagnosis not present

## 2017-01-04 DIAGNOSIS — Z13220 Encounter for screening for lipoid disorders: Secondary | ICD-10-CM

## 2017-01-04 DIAGNOSIS — Z114 Encounter for screening for human immunodeficiency virus [HIV]: Secondary | ICD-10-CM

## 2017-01-04 DIAGNOSIS — Z136 Encounter for screening for cardiovascular disorders: Secondary | ICD-10-CM

## 2017-01-04 DIAGNOSIS — Z0001 Encounter for general adult medical examination with abnormal findings: Secondary | ICD-10-CM | POA: Diagnosis not present

## 2017-01-04 LAB — LIPID PANEL
CHOL/HDL RATIO: 2
CHOLESTEROL: 199 mg/dL (ref 0–200)
HDL: 85.3 mg/dL (ref >39.00)
LDL CALC: 103 mg/dL — AB (ref 0–99)
NonHDL: 113.74
TRIGLYCERIDES: 54 mg/dL (ref 0.0–149.0)
VLDL: 10.8 mg/dL (ref 0.0–40.0)

## 2017-01-04 LAB — CBC WITH DIFFERENTIAL/PLATELET
BASOS ABS: 0 10*3/uL (ref 0.0–0.1)
BASOS PCT: 0.6 % (ref 0.0–3.0)
EOS ABS: 0 10*3/uL (ref 0.0–0.7)
Eosinophils Relative: 0.3 % (ref 0.0–5.0)
HEMATOCRIT: 43.8 % (ref 36.0–46.0)
HEMOGLOBIN: 14.3 g/dL (ref 12.0–15.0)
LYMPHS PCT: 32.4 % (ref 12.0–46.0)
Lymphs Abs: 2.2 10*3/uL (ref 0.7–4.0)
MCHC: 32.5 g/dL (ref 30.0–36.0)
MCV: 88.2 fl (ref 78.0–100.0)
MONOS PCT: 5.8 % (ref 3.0–12.0)
Monocytes Absolute: 0.4 10*3/uL (ref 0.1–1.0)
NEUTROS ABS: 4.2 10*3/uL (ref 1.4–7.7)
Neutrophils Relative %: 60.9 % (ref 43.0–77.0)
PLATELETS: 184 10*3/uL (ref 150.0–400.0)
RBC: 4.97 Mil/uL (ref 3.87–5.11)
RDW: 13.2 % (ref 11.5–15.5)
WBC: 6.9 10*3/uL (ref 4.0–10.5)

## 2017-01-04 LAB — POCT URINALYSIS DIPSTICK
Bilirubin, UA: NEGATIVE
Blood, UA: NEGATIVE
GLUCOSE UA: NEGATIVE
KETONES UA: NEGATIVE
Nitrite, UA: NEGATIVE
PROTEIN UA: NEGATIVE
SPEC GRAV UA: 1.02 (ref 1.010–1.025)
UROBILINOGEN UA: 0.2 U/dL
pH, UA: 6 (ref 5.0–8.0)

## 2017-01-04 LAB — COMPREHENSIVE METABOLIC PANEL
ALBUMIN: 4.6 g/dL (ref 3.5–5.2)
ALT: 12 U/L (ref 0–35)
AST: 21 U/L (ref 0–37)
Alkaline Phosphatase: 38 U/L — ABNORMAL LOW (ref 39–117)
BUN: 13 mg/dL (ref 6–23)
CALCIUM: 9.4 mg/dL (ref 8.4–10.5)
CHLORIDE: 101 meq/L (ref 96–112)
CO2: 28 meq/L (ref 19–32)
Creatinine, Ser: 0.67 mg/dL (ref 0.40–1.20)
GFR: 111.48 mL/min (ref >60.00)
Glucose, Bld: 83 mg/dL (ref 70–99)
Potassium: 3.8 mEq/L (ref 3.5–5.1)
Sodium: 136 mEq/L (ref 135–145)
Total Bilirubin: 0.8 mg/dL (ref 0.2–1.2)
Total Protein: 7.3 g/dL (ref 6.0–8.3)

## 2017-01-04 LAB — POCT URINE PREGNANCY: PREG TEST UR: NEGATIVE

## 2017-01-04 MED ORDER — NITROFURANTOIN MONOHYD MACRO 100 MG PO CAPS: 100.0000 mg | ORAL_CAPSULE | Freq: Two times a day (BID) | ORAL | 0 refills | Status: DC

## 2017-01-04 NOTE — Progress Notes (Signed)
Norma Garcia is a 26 y.o. female here to Establish Care, discuss dysuria and have CPE.  I acted as a Neurosurgeonscribe for Energy East CorporationSamantha Zowie Lundahl, PA-C Corky Mullonna Orphanos, LPN  History of Present Illness:   Chief Complaint  Patient presents with  . Establish Care  . Urinary Tract Infection  . Dysuria   Has not had a PCP in the past 4 years since moving from [place]. She has had two children while here in the [place] and has been seeing her Ob-Gyn for most of her care.  Acute Concerns: Dysuria -- hx of UTIs, 6 years ago she had "7 in one year." Most recent UTI was during that time (6 years ago). However about 1 week ago, she has developed dysuria. Tried to treat this at home with AZO, cranberry juice, and probiotics, however she is concerned because symptoms have not completely resolved. IUD placed about 1 - 1.5 years ago. Has been pushing fluids. Has not seen any blood or unusual vaginal discharge. No fever, changes in appetite, flank pain. Does report that she has sexual intercourse with her husband about 2-3 days prior to onset of symptoms.  Health Maintenance: Immunizations -- declined flu shot, everything else up to date Colonoscopy -- none, no family history -- start at age 89 Mammogram -- none, no family history -- start at age 26 PAP -- no abnormal, up to date, requesting records Diet -- eat all food groups Caffeine intake -- coffee x 1 daily Sleep habits -- sleeps well Exercise -- does get scheduled exercise in, 2-3 x weeks goes running  Weight -- Weight: 118 lb 4 oz (53.6 kg)  -- reports that she would be more comfortable at 113 lb Mood -- no issues with anxiety or anger  Depression screen PHQ 2/9 01/04/2017  Decreased Interest 0  Down, Depressed, Hopeless 0  PHQ - 2 Score 0    No flowsheet data found.  Other providers/specialists: Ob-Gyn Dentist Needs an eye doctor  Past Medical History:  Diagnosis Date  . History of chicken pox   . Hx of urinary tract infection      Social History    Socioeconomic History  . Marital status: Not on file    Spouse name: Not on file  . Number of children: Not on file  . Years of education: Not on file  . Highest education level: Not on file  Social Needs  . Financial resource strain: Not on file  . Food insecurity - worry: Not on file  . Food insecurity - inability: Not on file  . Transportation needs - medical: Not on file  . Transportation needs - non-medical: Not on file  Occupational History  . Not on file  Tobacco Use  . Smoking status: Never Smoker  . Smokeless tobacco: Never Used  Substance and Sexual Activity  . Alcohol use: Yes    Frequency: Never    Comment: socially  . Drug use: No  . Sexual activity: Yes    Birth control/protection: IUD    Comment: Paragard  Other Topics Concern  . 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 CESAREAN SECTION  2015    Family History  Problem Relation Age of Onset  . Hypertension Father   . Breast cancer Neg Hx   . Colon cancer Neg  Hx     No Known Allergies   Current Medications:   Current Outpatient Medications:  .  ibuprofen (ADVIL,MOTRIN) 200 MG tablet, Take 400 mg as needed by mouth., Disp: , Rfl:  .  PARAGARD INTRAUTERINE COPPER IU, by Intrauterine route. IUD was placed 07/2015 by GYN, needs to be removed in 07/2025., Disp: , Rfl:  .  Probiotic Product (PROBIOTIC PO), Take 1 capsule daily by mouth., Disp: , Rfl:  .  nitrofurantoin, macrocrystal-monohydrate, (MACROBID) 100 MG capsule, Take 1 capsule (100 mg total) 2 (two) times daily by mouth., Disp: 10 capsule, Rfl: 0   Review of Systems:   Review of Systems  Constitutional: Negative for chills, fever, malaise/fatigue and weight loss.  HENT: Negative for hearing loss, sinus pain and sore throat.   Respiratory: Negative for cough and hemoptysis.   Cardiovascular:  Negative for chest pain, palpitations, leg swelling and PND.  Gastrointestinal: Negative for abdominal pain, constipation, diarrhea, heartburn, nausea and vomiting.  Genitourinary: Positive for dysuria. Negative for frequency and urgency.  Musculoskeletal: Negative for back pain, myalgias and neck pain.  Skin: Negative for itching and rash.  Neurological: Negative for dizziness, tingling, seizures and headaches.  Endo/Heme/Allergies: Negative for polydipsia.  Psychiatric/Behavioral: Negative for depression. The patient is not nervous/anxious.     Vitals:   Vitals:   01/04/17 1005  BP: 108/76  Pulse: 64  Temp: 97.7 F (36.5 C)  TempSrc: Oral  SpO2: 98%  Weight: 118 lb 4 oz (53.6 kg)  Height: 5' (1.524 m)     Body mass index is 23.09 kg/m.  Physical Exam:   Physical Exam  Constitutional: She appears well-developed and well-nourished. She is cooperative.  Non-toxic appearance. She does not have a sickly appearance. She does not appear ill. No distress.  HENT:  Head: Normocephalic and atraumatic.  Right Ear: Tympanic membrane, external ear and ear canal normal. Tympanic membrane is not erythematous, not retracted and not bulging.  Left Ear: Tympanic membrane, external ear and ear canal normal. Tympanic membrane is not erythematous, not retracted and not bulging.  Eyes: Conjunctivae, EOM and lids are normal. Pupils are equal, round, and reactive to light.  Neck: Trachea normal and full passive range of motion without pain.  Cardiovascular: Normal rate, regular rhythm, S1 normal, S2 normal, normal heart sounds and intact distal pulses.  Pulmonary/Chest: Effort normal and breath sounds normal. No tachypnea. No respiratory distress. She has no decreased breath sounds. She has no wheezes. She has no rhonchi. She has no rales.  Abdominal: Soft. Normal appearance and bowel sounds are normal. There is no tenderness.  Musculoskeletal: Normal range of motion.  Lymphadenopathy:    She has  no cervical adenopathy.  Neurological: She is alert. She has normal reflexes. No cranial nerve deficit or sensory deficit. GCS eye subscore is 4. GCS verbal subscore is 5. GCS motor subscore is 6.  Skin: Skin is warm, dry and intact.  Psychiatric: She has a normal mood and affect. Her speech is normal and behavior is normal.  Nursing note and vitals reviewed.   Results for orders placed or performed in visit on 01/04/17  POCT urinalysis dipstick  Result Value Ref Range   Color, UA Yellow    Clarity, UA Cloudy    Glucose, UA Negative    Bilirubin, UA Negative    Ketones, UA Negative    Spec Grav, UA 1.020 1.010 - 1.025   Blood, UA Negative    pH, UA 6.0 5.0 - 8.0   Protein, UA  Negative    Urobilinogen, UA 0.2 0.2 or 1.0 E.U./dL   Nitrite, UA Negative    Leukocytes, UA Large (3+) (A) Negative  POCT urine pregnancy  Result Value Ref Range   Preg Test, Ur Negative Negative     Assessment and Plan:    Norma Garcia was seen today for establish care, urinary tract infection and dysuria.  Diagnoses and all orders for this visit:  Encounter for general adult medical examination with abnormal findings Today patient counseled on age appropriate routine health concerns for screening and prevention, each reviewed and up to date or declined. Immunizations reviewed and up to date or declined. Labs ordered and reviewed. Risk factors for depression reviewed and negative. Hearing function and visual acuity are intact. ADLs screened and addressed as needed. Functional ability and level of safety reviewed and appropriate. Education, counseling and referrals performed based on assessed risks today. Patient provided with a copy of personalized plan for preventive services. -     Comprehensive metabolic panel -     CBC with Differential/Platelet  Dysuria UA with significant leukocytes. Urine pregnancy negative. Treat with Macrobid per orders. Will order urine culture. -     POCT urinalysis dipstick -      POCT urine pregnancy -     Urine Culture  Encounter for screening for HIV She is agreeable to once in a lifetime screening, will order at this time. -     HIV antibody  Encounter for lipid screening for cardiovascular disease She is fasting today, will check labs. -     Lipid panel  Other orders -     nitrofurantoin, macrocrystal-monohydrate, (MACROBID) 100 MG capsule; Take 1 capsule (100 mg total) 2 (two) times daily by mouth.  . Reviewed expectations re: course of current medical issues. . Discussed self-management of symptoms. . Outlined signs and symptoms indicating need for more acute intervention. . Patient verbalized understanding and all questions were answered. . See orders for this visit as documented in the electronic medical record. . Patient received an After-Visit Summary.   CMA or LPN served as scribe during this visit. History, Physical, and Plan performed by medical provider. Documentation and orders reviewed and attested to.   Jarold Motto, PA-C

## 2017-01-04 NOTE — Patient Instructions (Addendum)
It was great to meet you!  Start the [place] per orders for your urinary tract infection. Drink plenty of water.  We will call you with your lab results.  Health Maintenance, Female Adopting a healthy lifestyle and getting preventive care can go a long way to promote health and wellness. Talk with your health care provider about what schedule of regular examinations is right for you. This is a good chance for you to check in with your provider about disease prevention and staying healthy. In between checkups, there are plenty of things you can do on your own. Experts have done a lot of research about which lifestyle changes and preventive measures are most likely to keep you healthy. Ask your health care provider for more information. Weight and diet Eat a healthy diet  Be sure to include plenty of vegetables, fruits, low-fat dairy products, and lean protein.  Do not eat a lot of foods high in solid fats, added sugars, or salt.  Get regular exercise. This is one of the most important things you can do for your health. ? Most adults should exercise for at least 150 minutes each week. The exercise should increase your heart rate and make you sweat (moderate-intensity exercise). ? Most adults should also do strengthening exercises at least twice a week. This is in addition to the moderate-intensity exercise.  Maintain a healthy weight  Body mass index (BMI) is a measurement that can be used to identify possible weight problems. It estimates body fat based on height and weight. Your health care provider can help determine your BMI and help you achieve or maintain a healthy weight.  For females 6 years of age and older: ? A BMI below 18.5 is considered underweight. ? A BMI of 18.5 to 24.9 is normal. ? A BMI of 25 to 29.9 is considered overweight. ? A BMI of 30 and above is considered obese.  Watch levels of cholesterol and blood lipids  You should start having your blood tested for  lipids and cholesterol at 28 years of age, then have this test every 5 years.  You may need to have your cholesterol levels checked more often if: ? Your lipid or cholesterol levels are high. ? You are older than 28 years of age. ? You are at high risk for heart disease.  Cancer screening Lung Cancer  Lung cancer screening is recommended for adults 63-40 years old who are at high risk for lung cancer because of a history of smoking.  A yearly low-dose CT scan of the lungs is recommended for people who: ? Currently smoke. ? Have quit within the past 15 years. ? Have at least a 30-pack-year history of smoking. A pack year is smoking an average of one pack of cigarettes a day for 1 year.  Yearly screening should continue until it has been 15 years since you quit.  Yearly screening should stop if you develop a health problem that would prevent you from having lung cancer treatment.  Breast Cancer  Practice breast self-awareness. This means understanding how your breasts normally appear and feel.  It also means doing regular breast self-exams. Let your health care provider know about any changes, no matter how small.  If you are in your 20s or 30s, you should have a clinical breast exam (CBE) by a health care provider every 1-3 years as part of a regular health exam.  If you are 25 or older, have a CBE every year. Also consider having a  breast X-ray (mammogram) every year.  If you have a family history of breast cancer, talk to your health care provider about genetic screening.  If you are at high risk for breast cancer, talk to your health care provider about having an MRI and a mammogram every year.  Breast cancer gene (BRCA) assessment is recommended for women who have family members with BRCA-related cancers. BRCA-related cancers include: ? Breast. ? Ovarian. ? Tubal. ? Peritoneal cancers.  Results of the assessment will determine the need for genetic counseling and BRCA1 and  BRCA2 testing.  Cervical Cancer Your health care provider may recommend that you be screened regularly for cancer of the pelvic organs (ovaries, uterus, and vagina). This screening involves a pelvic examination, including checking for microscopic changes to the surface of your cervix (Pap test). You may be encouraged to have this screening done every 3 years, beginning at age 55.  For women ages 56-65, health care providers may recommend pelvic exams and Pap testing every 3 years, or they may recommend the Pap and pelvic exam, combined with testing for human papilloma virus (HPV), every 5 years. Some types of HPV increase your risk of cervical cancer. Testing for HPV may also be done on women of any age with unclear Pap test results.  Other health care providers may not recommend any screening for nonpregnant women who are considered low risk for pelvic cancer and who do not have symptoms. Ask your health care provider if a screening pelvic exam is right for you.  If you have had past treatment for cervical cancer or a condition that could lead to cancer, you need Pap tests and screening for cancer for at least 20 years after your treatment. If Pap tests have been discontinued, your risk factors (such as having a new sexual partner) need to be reassessed to determine if screening should resume. Some women have medical problems that increase the chance of getting cervical cancer. In these cases, your health care provider may recommend more frequent screening and Pap tests.  Colorectal Cancer  This type of cancer can be detected and often prevented.  Routine colorectal cancer screening usually begins at 28 years of age and continues through 28 years of age.  Your health care provider may recommend screening at an earlier age if you have risk factors for colon cancer.  Your health care provider may also recommend using home test kits to check for hidden blood in the stool.  A small camera at the  end of a tube can be used to examine your colon directly (sigmoidoscopy or colonoscopy). This is done to check for the earliest forms of colorectal cancer.  Routine screening usually begins at age 5.  Direct examination of the colon should be repeated every 5-10 years through 28 years of age. However, you may need to be screened more often if early forms of precancerous polyps or small growths are found.  Skin Cancer  Check your skin from head to toe regularly.  Tell your health care provider about any new moles or changes in moles, especially if there is a change in a mole's shape or color.  Also tell your health care provider if you have a mole that is larger than the size of a pencil eraser.  Always use sunscreen. Apply sunscreen liberally and repeatedly throughout the day.  Protect yourself by wearing long sleeves, pants, a wide-brimmed hat, and sunglasses whenever you are outside.  Heart disease, diabetes, and high blood pressure  High  blood pressure causes heart disease and increases the risk of stroke. High blood pressure is more likely to develop in: ? People who have blood pressure in the high end of the normal range (130-139/85-89 mm Hg). ? People who are overweight or obese. ? People who are African American.  If you are 27-48 years of age, have your blood pressure checked every 3-5 years. If you are 45 years of age or older, have your blood pressure checked every year. You should have your blood pressure measured twice-once when you are at a hospital or clinic, and once when you are not at a hospital or clinic. Record the average of the two measurements. To check your blood pressure when you are not at a hospital or clinic, you can use: ? An automated blood pressure machine at a pharmacy. ? A home blood pressure monitor.  If you are between 50 years and 51 years old, ask your health care provider if you should take aspirin to prevent strokes.  Have regular diabetes  screenings. This involves taking a blood sample to check your fasting blood sugar level. ? If you are at a normal weight and have a low risk for diabetes, have this test once every three years after 28 years of age. ? If you are overweight and have a high risk for diabetes, consider being tested at a younger age or more often. Preventing infection Hepatitis B  If you have a higher risk for hepatitis B, you should be screened for this virus. You are considered at high risk for hepatitis B if: ? You were born in a country where hepatitis B is common. Ask your health care provider which countries are considered high risk. ? Your parents were born in a high-risk country, and you have not been immunized against hepatitis B (hepatitis B vaccine). ? You have HIV or AIDS. ? You use needles to inject street drugs. ? You live with someone who has hepatitis B. ? You have had sex with someone who has hepatitis B. ? You get hemodialysis treatment. ? You take certain medicines for conditions, including cancer, organ transplantation, and autoimmune conditions.  Hepatitis C  Blood testing is recommended for: ? Everyone born from [birth year] through 1967. ? Anyone with known risk factors for hepatitis C.  Sexually transmitted infections (STIs)  You should be screened for sexually transmitted infections (STIs) including gonorrhea and chlamydia if: ? You are sexually active and are younger than 28 years of age. ? You are older than 28 years of age and your health care provider tells you that you are at risk for this type of infection. ? Your sexual activity has changed since you were last screened and you are at an increased risk for chlamydia or gonorrhea. Ask your health care provider if you are at risk.  If you do not have HIV, but are at risk, it may be recommended that you take a prescription medicine daily to prevent HIV infection. This is called pre-exposure prophylaxis (PrEP). You are considered at risk  if: ? You are sexually active and do not regularly use condoms or know the HIV status of your partner(s). ? You take drugs by injection. ? You are sexually active with a partner who has HIV.  Talk with your health care provider about whether you are at high risk of being infected with HIV. If you choose to begin PrEP, you should first be tested for HIV. You should then be tested every 3 months for  as long as you are taking PrEP. Pregnancy  If you are premenopausal and you may become pregnant, ask your health care provider about preconception counseling.  If you may become pregnant, take 400 to 800 micrograms (mcg) of folic acid every day.  If you want to prevent pregnancy, talk to your health care provider about birth control (contraception). Osteoporosis and menopause  Osteoporosis is a disease in which the bones lose minerals and strength with aging. This can result in serious bone fractures. Your risk for osteoporosis can be identified using a bone density scan.  If you are 69 years of age or older, or if you are at risk for osteoporosis and fractures, ask your health care provider if you should be screened.  Ask your health care provider whether you should take a calcium or vitamin D supplement to lower your risk for osteoporosis.  Menopause may have certain physical symptoms and risks.  Hormone replacement therapy may reduce some of these symptoms and risks. Talk to your health care provider about whether hormone replacement therapy is right for you. Follow these instructions at home:  Schedule regular health, dental, and eye exams.  Stay current with your immunizations.  Do not use any tobacco products including cigarettes, chewing tobacco, or electronic cigarettes.  If you are pregnant, do not drink alcohol.  If you are breastfeeding, limit how much and how often you drink alcohol.  Limit alcohol intake to no more than 1 drink per day for nonpregnant women. One drink  equals 12 ounces of beer, 5 ounces of wine, or 1 ounces of hard liquor.  Do not use street drugs.  Do not share needles.  Ask your health care provider for help if you need support or information about quitting drugs.  Tell your health care provider if you often feel depressed.  Tell your health care provider if you have ever been abused or do not feel safe at home. This information is not intended to replace advice given to you by your health care provider. Make sure you discuss any questions you have with your health care provider. Document Released: 08/23/2010 Document Revised: 07/16/2015 Document Reviewed: 11/11/2014 Elsevier Interactive Patient Education  [employer].

## 2017-01-05 LAB — HIV ANTIBODY (ROUTINE TESTING W REFLEX): HIV 1&2 Ab, 4th Generation: NONREACTIVE

## 2017-01-06 ENCOUNTER — Encounter (HOSPITAL_COMMUNITY): Payer: Self-pay | Admitting: Obstetrics and Gynecology

## 2017-01-07 LAB — URINE CULTURE
MICRO NUMBER:: 81285143
SPECIMEN QUALITY: ADEQUATE

## 2017-01-16 ENCOUNTER — Encounter: Payer: Self-pay | Admitting: Physician Assistant

## 2017-01-19 IMAGING — US US MFM OB LIMITED
1 series · 12 of 12 positions shown · non-contrast
Comparison: none

[Series 1: us mfm ob limited · 12 acquisitions, 12 frames shown]
[im 1/12]
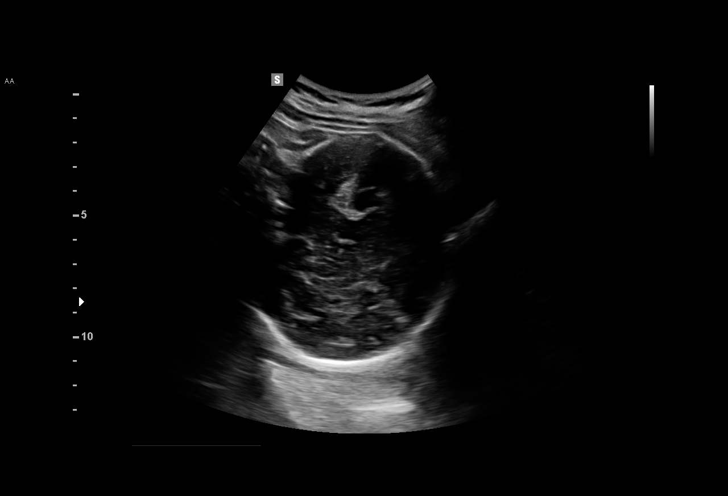
[im 2/12]
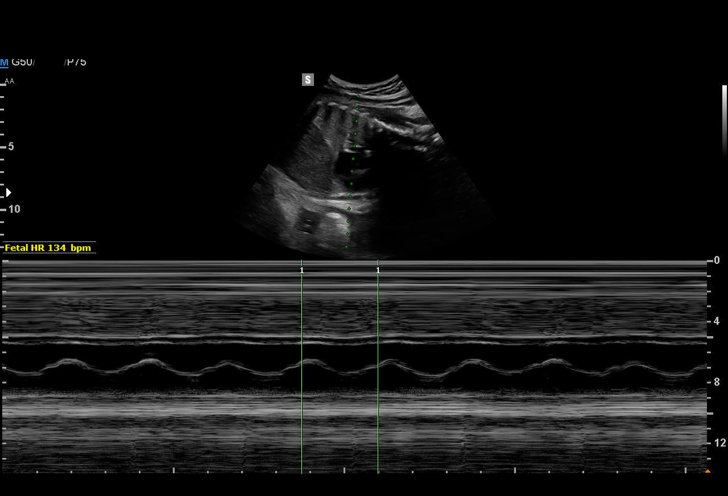
[im 3/12]
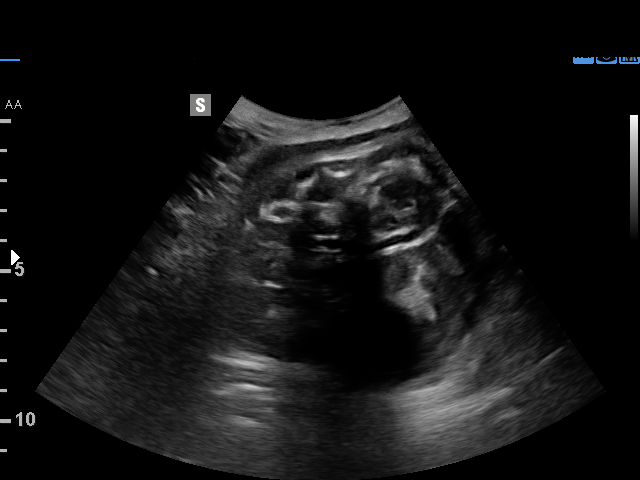
[im 4/12]
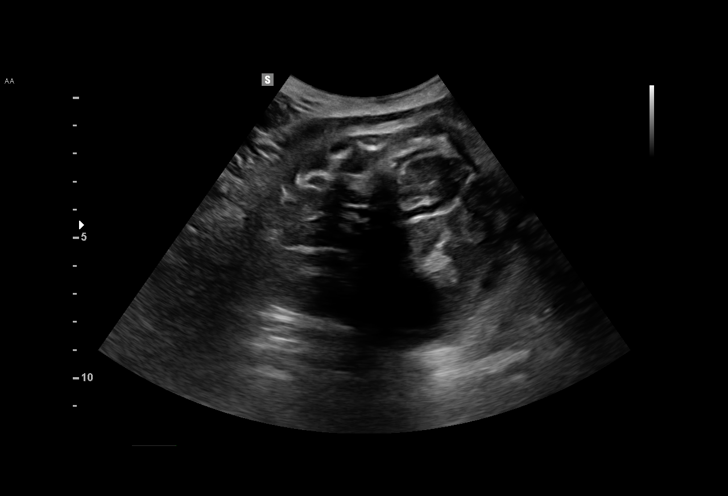
[im 5/12]
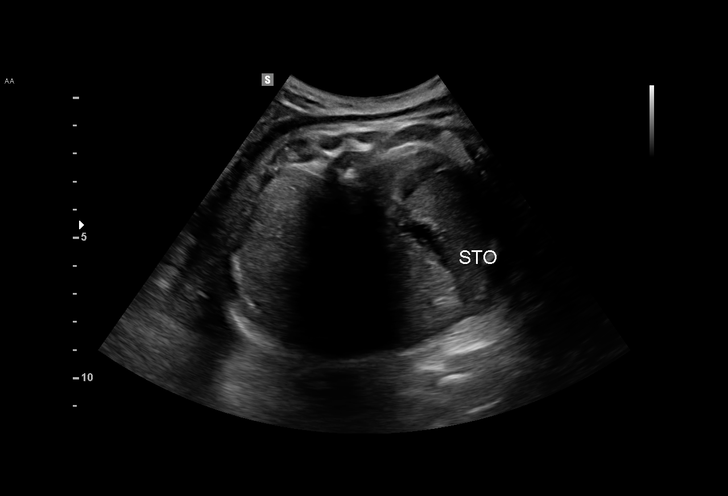
[im 6/12]
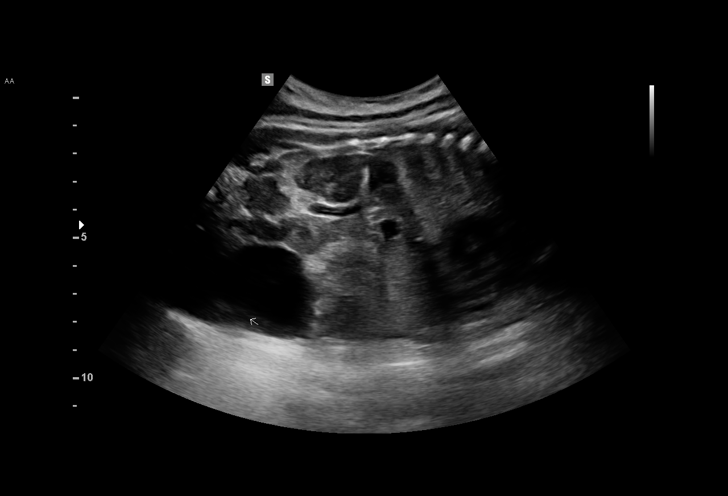
[im 7/12]
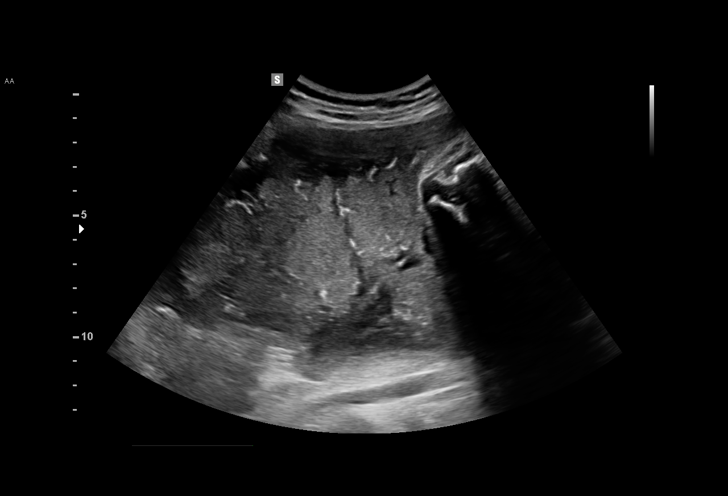
[im 8/12]
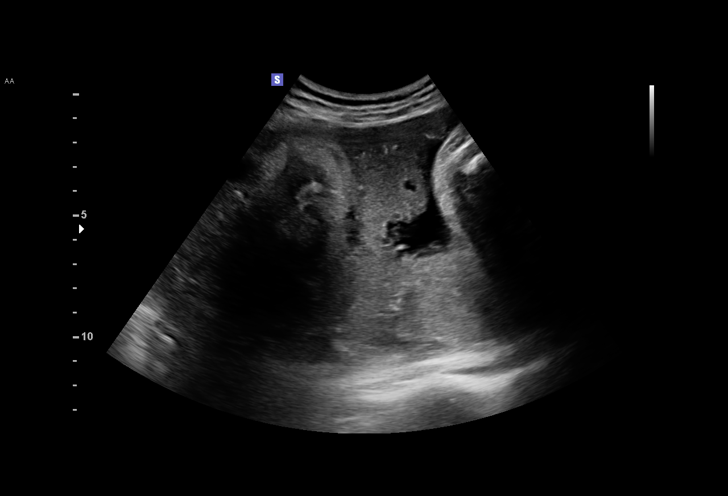
[im 9/12]
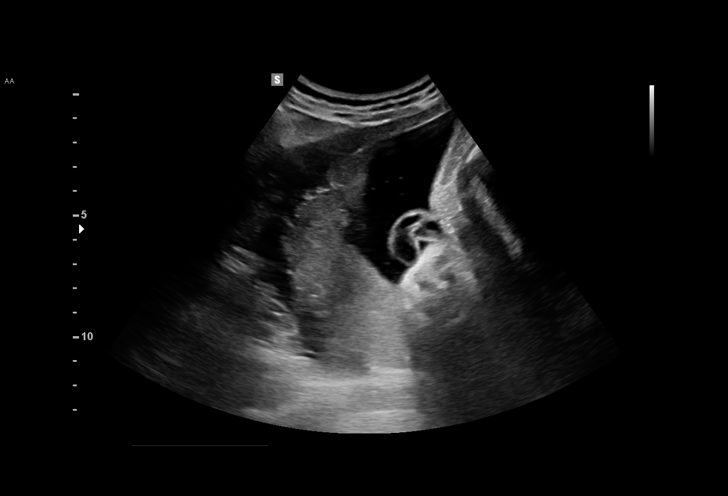
[im 10/12]
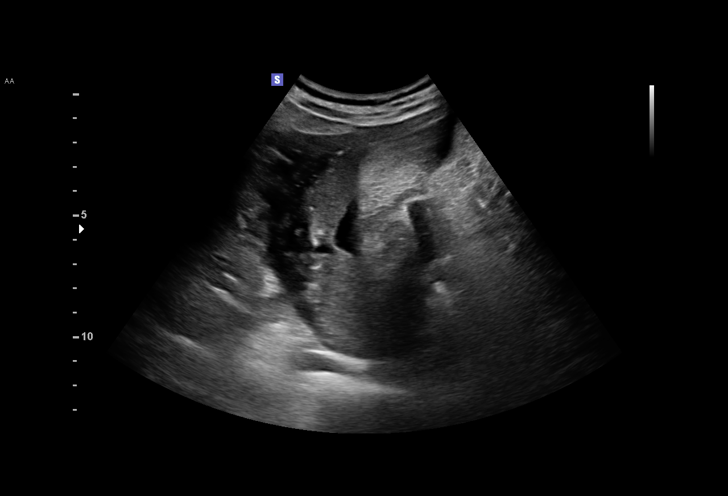
[im 11/12]
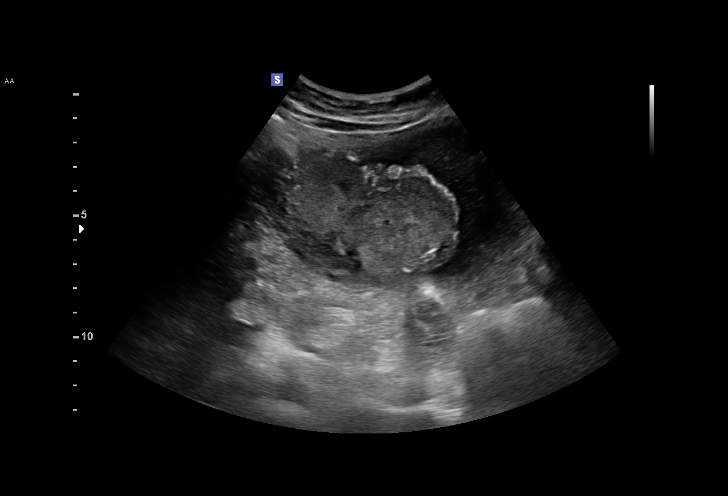
[im 12/12]
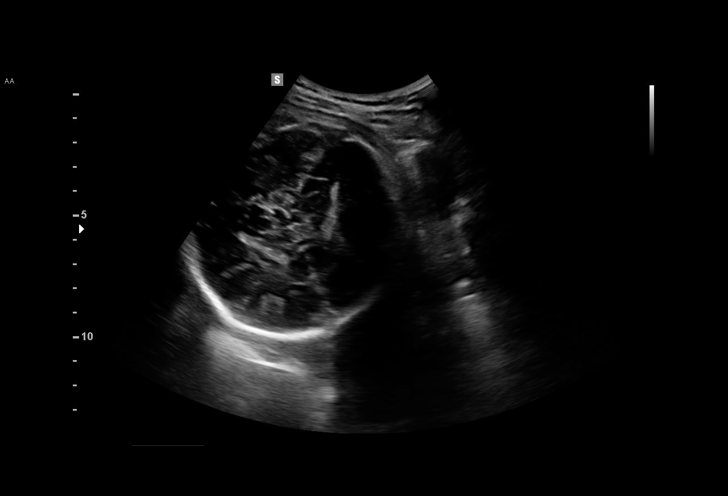

[12 of 12 positions shown; findings below may reference images not displayed]

& Infertility
2615 [REDACTED]
[REDACTED]7
Secondary Phy.:   [facility]                Location:         [HOSPITAL]

1  LORA CORTEZ             [id number]      [id number]     [id number]
Indications

37 weeks gestation of pregnancy
Previous cesarean delivery, antepartum
Oligohydramnios / Decreased amniotic fluid
volume
S/P External Cephalic Version this morning
OB History

Gravidity:    2         Term:   1        Prem:   0        SAB:   0
TOP:          0       Ectopic:  0        Living: 1
Fetal Evaluation

Num Of Fetuses:     1
Fetal Heart         134
Rate(bpm):
Cardiac Activity:   Observed
Presentation:       Cephalic
Placenta:           Right lateral, above cervical os

Amniotic Fluid
AFI FV:      Subjectively within normal limits
AFI Sum:     14.86   cm       56  %Tile     Larg Pckt:    4.61  cm
RUQ:   4.61    cm   RLQ:    4.04   cm    LUQ:   2.52    cm   LLQ:    3.69   cm
Gestational Age
LMP:           38w 4d        Date:  09/04/14                 EDD:   06/11/15
Clinical EDD:  37w 4d                                        EDD:   06/18/15
Best:          37w 4d     Det. By:  Clinical EDD             EDD:   06/18/15
Impression

SIUP at 37+4 weeks
Cephalic presentation
Normal amniotic fluid volume
Recommendations

Follow-up as clinically indicated

## 2017-03-31 ENCOUNTER — Ambulatory Visit: Payer: BLUE CROSS/BLUE SHIELD | Admitting: Family Medicine

## 2017-05-12 ENCOUNTER — Encounter: Payer: Self-pay | Admitting: Obstetrics and Gynecology

## 2017-06-10 DIAGNOSIS — J020 Streptococcal pharyngitis: Secondary | ICD-10-CM | POA: Diagnosis not present

## 2017-06-10 DIAGNOSIS — J029 Acute pharyngitis, unspecified: Secondary | ICD-10-CM | POA: Diagnosis not present

## 2017-06-21 ENCOUNTER — Encounter: Payer: Self-pay | Admitting: Obstetrics and Gynecology

## 2017-07-12 DIAGNOSIS — R102 Pelvic and perineal pain: Secondary | ICD-10-CM | POA: Diagnosis not present

## 2017-07-12 DIAGNOSIS — Z30431 Encounter for routine checking of intrauterine contraceptive device: Secondary | ICD-10-CM | POA: Diagnosis not present

## 2017-07-26 ENCOUNTER — Encounter: Payer: Self-pay | Admitting: Obstetrics and Gynecology

## 2017-08-15 DIAGNOSIS — Z01419 Encounter for gynecological examination (general) (routine) without abnormal findings: Secondary | ICD-10-CM | POA: Diagnosis not present

## 2017-08-15 DIAGNOSIS — Z6822 Body mass index (BMI) 22.0-22.9, adult: Secondary | ICD-10-CM | POA: Diagnosis not present

## 2017-08-15 LAB — HM PAP SMEAR

## 2017-08-16 DIAGNOSIS — Z1329 Encounter for screening for other suspected endocrine disorder: Secondary | ICD-10-CM | POA: Diagnosis not present

## 2017-08-16 DIAGNOSIS — Z13220 Encounter for screening for lipoid disorders: Secondary | ICD-10-CM | POA: Diagnosis not present

## 2017-09-08 ENCOUNTER — Encounter: Payer: Self-pay | Admitting: *Deleted

## 2017-09-11 DIAGNOSIS — N644 Mastodynia: Secondary | ICD-10-CM | POA: Diagnosis not present

## 2017-09-12 ENCOUNTER — Telehealth: Payer: Self-pay | Admitting: *Deleted

## 2017-09-12 NOTE — Telephone Encounter (Signed)
See note

## 2017-09-12 NOTE — Telephone Encounter (Signed)
Spoke to pt, told her [facility] and they sent the result over but it had a different last name. Pt said yes Norma Garcia is her maiden name she did not change her name there. Told pt okay I just wanted to make sure it was correct. Pt verbalized understanding. Also reminded pt she is due for physical in Nov. Pt verbalized understanding.

## 2017-09-12 NOTE — Telephone Encounter (Signed)
Patient returning call.

## 2017-09-12 NOTE — Telephone Encounter (Signed)
Left message on voicemail to call office.  

## 2017-12-25 ENCOUNTER — Encounter: Payer: BLUE CROSS/BLUE SHIELD | Admitting: Physician Assistant

## 2017-12-27 ENCOUNTER — Ambulatory Visit (INDEPENDENT_AMBULATORY_CARE_PROVIDER_SITE_OTHER): Payer: BLUE CROSS/BLUE SHIELD | Admitting: Physician Assistant

## 2017-12-27 ENCOUNTER — Encounter: Payer: Self-pay | Admitting: Physician Assistant

## 2017-12-27 VITALS — BP 110/70 | HR 81 | Temp 98.8°F | Ht 60.0 in | Wt 119.0 lb

## 2017-12-27 DIAGNOSIS — F419 Anxiety disorder, unspecified: Secondary | ICD-10-CM

## 2017-12-27 DIAGNOSIS — M546 Pain in thoracic spine: Secondary | ICD-10-CM | POA: Diagnosis not present

## 2017-12-27 DIAGNOSIS — Z136 Encounter for screening for cardiovascular disorders: Secondary | ICD-10-CM | POA: Diagnosis not present

## 2017-12-27 DIAGNOSIS — Z13220 Encounter for screening for lipoid disorders: Secondary | ICD-10-CM

## 2017-12-27 DIAGNOSIS — Z0001 Encounter for general adult medical examination with abnormal findings: Secondary | ICD-10-CM | POA: Diagnosis not present

## 2017-12-27 DIAGNOSIS — Z8349 Family history of other endocrine, nutritional and metabolic diseases: Secondary | ICD-10-CM

## 2017-12-27 DIAGNOSIS — N644 Mastodynia: Secondary | ICD-10-CM

## 2017-12-27 DIAGNOSIS — R5383 Other fatigue: Secondary | ICD-10-CM

## 2017-12-27 NOTE — Patient Instructions (Signed)
It was great to see you!  Please go to the lab for blood work.   Our office will call you with your results unless you have chosen to receive results via MyChart.  If your blood work is normal we will follow-up each year for physicals and as scheduled for chronic medical problems.  If anything is abnormal we will treat accordingly and get you in for a follow-up.  You will be contacted about physical therapy and your breast ultrasound.  Take care,  [facility] Maintenance, Female Adopting a healthy lifestyle and getting preventive care can go a long way to promote health and wellness. Talk with your health care provider about what schedule of regular examinations is right for you. This is a good chance for you to check in with your provider about disease prevention and staying healthy. In between checkups, there are plenty of things you can do on your own. Experts have done a lot of research about which lifestyle changes and preventive measures are most likely to keep you healthy. Ask your health care provider for more information. Weight and diet Eat a healthy diet  Be sure to include plenty of vegetables, fruits, low-fat dairy products, and lean protein.  Do not eat a lot of foods high in solid fats, added sugars, or salt.  Get regular exercise. This is one of the most important things you can do for your health. ? Most adults should exercise for at least 150 minutes each week. The exercise should increase your heart rate and make you sweat (moderate-intensity exercise). ? Most adults should also do strengthening exercises at least twice a week. This is in addition to the moderate-intensity exercise.  Maintain a healthy weight  Body mass index (BMI) is a measurement that can be used to identify possible weight problems. It estimates body fat based on height and weight. Your health care provider can help determine your BMI and help you achieve or maintain a healthy weight.  For  females 44 years of age and older: ? A BMI below 18.5 is considered underweight. ? A BMI of 18.5 to 24.9 is normal. ? A BMI of 25 to 29.9 is considered overweight. ? A BMI of 30 and above is considered obese.  Watch levels of cholesterol and blood lipids  You should start having your blood tested for lipids and cholesterol at 29 years of age, then have this test every 5 years.  You may need to have your cholesterol levels checked more often if: ? Your lipid or cholesterol levels are high. ? You are older than 29 years of age. ? You are at high risk for heart disease.  Cancer screening Lung Cancer  Lung cancer screening is recommended for adults 46-6 years old who are at high risk for lung cancer because of a history of smoking.  A yearly low-dose CT scan of the lungs is recommended for people who: ? Currently smoke. ? Have quit within the past 15 years. ? Have at least a 30-pack-year history of smoking. A pack year is smoking an average of one pack of cigarettes a day for 1 year.  Yearly screening should continue until it has been 15 years since you quit.  Yearly screening should stop if you develop a health problem that would prevent you from having lung cancer treatment.  Breast Cancer  Practice breast self-awareness. This means understanding how your breasts normally appear and feel.  It also means doing regular breast self-exams. Let your health  care provider know about any changes, no matter how small.  If you are in your 20s or 30s, you should have a clinical breast exam (CBE) by a health care provider every 1-3 years as part of a regular health exam.  If you are 37 or older, have a CBE every year. Also consider having a breast X-ray (mammogram) every year.  If you have a family history of breast cancer, talk to your health care provider about genetic screening.  If you are at high risk for breast cancer, talk to your health care provider about having an MRI and a  mammogram every year.  Breast cancer gene (BRCA) assessment is recommended for women who have family members with BRCA-related cancers. BRCA-related cancers include: ? Breast. ? Ovarian. ? Tubal. ? Peritoneal cancers.  Results of the assessment will determine the need for genetic counseling and BRCA1 and BRCA2 testing.  Cervical Cancer Your health care provider may recommend that you be screened regularly for cancer of the pelvic organs (ovaries, uterus, and vagina). This screening involves a pelvic examination, including checking for microscopic changes to the surface of your cervix (Pap test). You may be encouraged to have this screening done every 3 years, beginning at age 72.  For women ages 76-65, health care providers may recommend pelvic exams and Pap testing every 3 years, or they may recommend the Pap and pelvic exam, combined with testing for human papilloma virus (HPV), every 5 years. Some types of HPV increase your risk of cervical cancer. Testing for HPV may also be done on women of any age with unclear Pap test results.  Other health care providers may not recommend any screening for nonpregnant women who are considered low risk for pelvic cancer and who do not have symptoms. Ask your health care provider if a screening pelvic exam is right for you.  If you have had past treatment for cervical cancer or a condition that could lead to cancer, you need Pap tests and screening for cancer for at least 20 years after your treatment. If Pap tests have been discontinued, your risk factors (such as having a new sexual partner) need to be reassessed to determine if screening should resume. Some women have medical problems that increase the chance of getting cervical cancer. In these cases, your health care provider may recommend more frequent screening and Pap tests.  Colorectal Cancer  This type of cancer can be detected and often prevented.  Routine colorectal cancer screening usually  begins at 29 years of age and continues through 29 years of age.  Your health care provider may recommend screening at an earlier age if you have risk factors for colon cancer.  Your health care provider may also recommend using home test kits to check for hidden blood in the stool.  A small camera at the end of a tube can be used to examine your colon directly (sigmoidoscopy or colonoscopy). This is done to check for the earliest forms of colorectal cancer.  Routine screening usually begins at age 67.  Direct examination of the colon should be repeated every 5-10 years through 29 years of age. However, you may need to be screened more often if early forms of precancerous polyps or small growths are found.  Skin Cancer  Check your skin from head to toe regularly.  Tell your health care provider about any new moles or changes in moles, especially if there is a change in a mole's shape or color.  Also tell  your health care provider if you have a mole that is larger than the size of a pencil eraser.  Always use sunscreen. Apply sunscreen liberally and repeatedly throughout the day.  Protect yourself by wearing long sleeves, pants, a wide-brimmed hat, and sunglasses whenever you are outside.  Heart disease, diabetes, and high blood pressure  High blood pressure causes heart disease and increases the risk of stroke. High blood pressure is more likely to develop in: ? People who have blood pressure in the high end of the normal range (130-139/85-89 mm Hg). ? People who are overweight or obese. ? People who are African American.  If you are 69-47 years of age, have your blood pressure checked every 3-5 years. If you are 76 years of age or older, have your blood pressure checked every year. You should have your blood pressure measured twice-once when you are at a hospital or clinic, and once when you are not at a hospital or clinic. Record the average of the two measurements. To check your  blood pressure when you are not at a hospital or clinic, you can use: ? An automated blood pressure machine at a pharmacy. ? A home blood pressure monitor.  If you are between 13 years and 69 years old, ask your health care provider if you should take aspirin to prevent strokes.  Have regular diabetes screenings. This involves taking a blood sample to check your fasting blood sugar level. ? If you are at a normal weight and have a low risk for diabetes, have this test once every three years after 29 years of age. ? If you are overweight and have a high risk for diabetes, consider being tested at a younger age or more often. Preventing infection Hepatitis B  If you have a higher risk for hepatitis B, you should be screened for this virus. You are considered at high risk for hepatitis B if: ? You were born in a country where hepatitis B is common. Ask your health care provider which countries are considered high risk. ? Your parents were born in a high-risk country, and you have not been immunized against hepatitis B (hepatitis B vaccine). ? You have HIV or AIDS. ? You use needles to inject street drugs. ? You live with someone who has hepatitis B. ? You have had sex with someone who has hepatitis B. ? You get hemodialysis treatment. ? You take certain medicines for conditions, including cancer, organ transplantation, and autoimmune conditions.  Hepatitis C  Blood testing is recommended for: ? Everyone born from [birth year] through 1967. ? Anyone with known risk factors for hepatitis C.  Sexually transmitted infections (STIs)  You should be screened for sexually transmitted infections (STIs) including gonorrhea and chlamydia if: ? You are sexually active and are younger than 29 years of age. ? You are older than 29 years of age and your health care provider tells you that you are at risk for this type of infection. ? Your sexual activity has changed since you were last screened and you are at  an increased risk for chlamydia or gonorrhea. Ask your health care provider if you are at risk.  If you do not have HIV, but are at risk, it may be recommended that you take a prescription medicine daily to prevent HIV infection. This is called pre-exposure prophylaxis (PrEP). You are considered at risk if: ? You are sexually active and do not regularly use condoms or know the HIV status of your partner(s). ?  You take drugs by injection. ? You are sexually active with a partner who has HIV.  Talk with your health care provider about whether you are at high risk of being infected with HIV. If you choose to begin PrEP, you should first be tested for HIV. You should then be tested every 3 months for as long as you are taking PrEP. Pregnancy  If you are premenopausal and you may become pregnant, ask your health care provider about preconception counseling.  If you may become pregnant, take 400 to 800 micrograms (mcg) of folic acid every day.  If you want to prevent pregnancy, talk to your health care provider about birth control (contraception). Osteoporosis and menopause  Osteoporosis is a disease in which the bones lose minerals and strength with aging. This can result in serious bone fractures. Your risk for osteoporosis can be identified using a bone density scan.  If you are 66 years of age or older, or if you are at risk for osteoporosis and fractures, ask your health care provider if you should be screened.  Ask your health care provider whether you should take a calcium or vitamin D supplement to lower your risk for osteoporosis.  Menopause may have certain physical symptoms and risks.  Hormone replacement therapy may reduce some of these symptoms and risks. Talk to your health care provider about whether hormone replacement therapy is right for you. Follow these instructions at home:  Schedule regular health, dental, and eye exams.  Stay current with your immunizations.  Do not  use any tobacco products including cigarettes, chewing tobacco, or electronic cigarettes.  If you are pregnant, do not drink alcohol.  If you are breastfeeding, limit how much and how often you drink alcohol.  Limit alcohol intake to no more than 1 drink per day for nonpregnant women. One drink equals 12 ounces of beer, 5 ounces of wine, or 1 ounces of hard liquor.  Do not use street drugs.  Do not share needles.  Ask your health care provider for help if you need support or information about quitting drugs.  Tell your health care provider if you often feel depressed.  Tell your health care provider if you have ever been abused or do not feel safe at home. This information is not intended to replace advice given to you by your health care provider. Make sure you discuss any questions you have with your health care provider. Document Released: 08/23/2010 Document Revised: 07/16/2015 Document Reviewed: 11/11/2014 Elsevier Interactive Patient Education  [employer].

## 2017-12-27 NOTE — Progress Notes (Signed)
I acted as a Neurosurgeon for [employer], PA-C [employer], LPN  Subjective:    Norma Garcia is a 27 y.o. female and is here for a comprehensive physical exam.  HPI  There are no preventive care reminders to display for this patient.  Acute Concerns: Back pain -- having issues with her back on the left side. Hx of scoliosis. Carries her 29 year old on her left hip often and thinks that it is causing some issues with back. Pain is not severe enough to treat. Has been going on for a few months. Family history of thyroid issues -- sister is having her thyroid removed 2/2 nodules, this has patient feeling anxious about her thyroid. She denies any skin changes, significant weight changes, palpitations, issues with diarrhea/constipation. Fatigue/Anxiety --feeling off a few times throughout the year, suspects that it may be related to her anxiety. Feels overwhelmed at times. Sometimes her kids bring home viruses from daycare and she feels like she is going to develop something but never does. Starting therapy tomorrow. Breast pain -- having intermittent L and R breast pain. Sometimes has tenderness associated with her period but isn't sure that its that and she is worried. Went to ob-gyn and she said that they dismissed it. She drinks 3 espressos daily. No family hx of breast cancer.  Health Maintenance: Immunizations -- UTD, declines Flu Colonoscopy -- N/A Mammogram -- N/A PAP -- UTD, 08/15/2017 NILM, due 07/2020 Bone Density -- N/A Diet -- well balanced Caffeine intake -- 3 espressos daily Sleep habits -- good Exercise -- at least a few days a week Current Weight -- Weight: 119 lb (54 kg)  Weight History: Wt Readings from Last 10 Encounters:  12/27/17 119 lb (54 kg)  01/04/17 118 lb 4 oz (53.6 kg)  06/18/15 145 lb (65.8 kg)  06/01/15 141 lb (64 kg)  01/24/14 108 lb 3.2 oz (49.1 kg)  06/19/13 149 lb (67.6 kg)  06/18/13 149 lb (67.6 kg)  Mood -- anxious at times, denies  depresseion Patient's last menstrual period was 12/19/2017. Period characteristics -- has paraguard IUD, relatively regular   Depression screen PHQ 2/9 12/27/2017  Decreased Interest 0  Down, Depressed, Hopeless 0  PHQ - 2 Score 0     Other providers/specialists: Ob-gyn  UTD with dentist and eye doctor     PMHx, SurgHx, SocialHx, Medications, and Allergies were reviewed in the Visit Navigator and updated as appropriate.   Past Medical History:  Diagnosis Date  . Anxiety   . 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 Headache    occasional migraines during second trimester; none during third trimester  . History of chicken pox   . Hx of urinary tract infection   . Keloid   . Postpartum care following cesarean delivery (4/28) 06/19/2013  . Postpartum care following VBAC delivery (4/27) 06/18/2015     Past Surgical History:  Procedure Laterality Date  . CESAREAN SECTION N/A 06/18/2013   Procedure: CESAREAN SECTION;  Surgeon: Genia Del, MD;  Location: [facility];  Service: Obstetrics;  Laterality: N/A;  . CESAREAN SECTION  2015     Family History  Problem Relation Age of Onset  . Hypertension Father   . Hyperlipidemia Father   . Diabetes Paternal Grandmother   . Breast cancer Neg Hx   . Colon cancer Neg Hx     Social History   Tobacco Use  . Smoking status: Never Smoker  . Smokeless tobacco: Never Used  Substance Use Topics  .  Alcohol use: Yes    Frequency: Never    Comment: socially  . Drug use: No    Review of Systems:   Review of Systems  Constitutional: Negative.  Negative for chills, fever, malaise/fatigue and weight loss.  HENT: Negative.  Negative for hearing loss, sinus pain and sore throat.   Eyes: Negative.  Negative for blurred vision.  Respiratory: Negative.  Negative for cough and shortness of breath.   Cardiovascular: Negative.  Negative for chest pain, palpitations and leg swelling.  Gastrointestinal: Negative.  Negative for abdominal  pain, constipation, diarrhea, heartburn, nausea and vomiting.  Genitourinary: Negative.  Negative for dysuria, frequency and urgency.  Musculoskeletal: Positive for back pain. Negative for myalgias and neck pain.  Skin: Negative.  Negative for itching and rash.  Neurological: Negative.  Negative for dizziness, tingling, seizures, loss of consciousness and headaches.  Endo/Heme/Allergies: Negative.  Negative for polydipsia.  Psychiatric/Behavioral: Negative.  Negative for depression. The patient is not nervous/anxious.     Objective:   BP 110/70 (BP Location: Left Arm, Patient Position: Sitting, Cuff Size: Normal)   Pulse 81   Temp 98.8 F (37.1 C) (Oral)   Ht 5' (1.524 m)   Wt 119 lb (54 kg)   LMP 12/19/2017   SpO2 97%   Breastfeeding? No   BMI 23.24 kg/m   General Appearance:    Alert, cooperative, no distress, appears stated age  Head:    Normocephalic, without obvious abnormality, atraumatic  Eyes:    PERRL, conjunctiva/corneas clear, EOM's intact, fundi    benign, both eyes  Ears:    Normal TM's and external ear canals, both ears  Nose:   Nares normal, septum midline, mucosa normal, no drainage    or sinus tenderness  Throat:   Lips, mucosa, and tongue normal; teeth and gums normal  Neck:   Supple, symmetrical, trachea midline, no adenopathy;    thyroid:  no enlargement/tenderness/nodules; no carotid   bruit or JVD  Back:     Symmetric, no curvature, ROM normal, no CVA tenderness  Lungs:     Clear to auscultation bilaterally, respirations unlabored  Chest Wall:    No tenderness or deformity   Heart:    Regular rate and rhythm, S1 and S2 normal, no murmur, rub   or gallop  Breast Exam:    Tenderness to bilateral breasts, both at 12 o'clock, No masses, or nipple abnormality  Abdomen:     Soft, non-tender, bowel sounds active all four quadrants,    no masses, no organomegaly  Genitalia:   N/a  Rectal:   N/a  Extremities:   Extremities normal, atraumatic, no cyanosis or  edema  Pulses:   2+ and symmetric all extremities  Skin:   Skin color, texture, turgor normal, no rashes or lesions  Lymph nodes:   Cervical, supraclavicular, and axillary nodes normal  Neurologic:   CNII-XII intact, normal strength, sensation and reflexes    throughout    Assessment/Plan:   Marchel was seen today for annual exam.  Diagnoses and all orders for this visit:  Encounter for general adult medical examination with abnormal findings Today patient counseled on age appropriate routine health concerns for screening and prevention, each reviewed and up to date or declined. Immunizations reviewed and up to date or declined. Labs ordered and reviewed. Risk factors for depression reviewed and negative. Hearing function and visual acuity are intact. ADLs screened and addressed as needed. Functional ability and level of safety reviewed and appropriate. Education, counseling and referrals  performed based on assessed risks today. Patient provided with a copy of personalized plan for preventive services.  Acute left-sided thoracic back pain Suspect due to lifting mechanics. No red flags on exam. Refer to PT. -     CBC with Differential/Platelet -     Comprehensive metabolic panel -     Ambulatory referral to Physical Therapy  Encounter for lipid screening for cardiovascular disease -     Lipid panel  Breast pain Significant anxiety regarding this. Will refer for ultrasound for further work up. Recommended limiting caffeine. -     US BREAST LTD UNI LEFT INC AXILLA; Future -     US BREAST LTD UNI RIGHT INC AXILLA; Future  Anxiety Starting talk therapy tomorrow. She would like to wait on medication at this time. -     TSH  Family history of thyroid disease Check TSH.  Fatigue, unspecified type Likely multifactorial. Labs to r/o organic cause. Follow-up if symptoms persist. Discussed mental health and working on exercise, limiting caffeine.   Well Adult Exam: Labs ordered: Yes.  Patient counseling was done. See below for items discussed. Discussed the patient's BMI. The BMI BMI is in the acceptable range Follow up in one year.  Patient Counseling:   [x]     Nutrition: Stressed importance of moderation in sodium/caffeine intake, saturated fat and cholesterol, caloric balance, sufficient intake of fresh fruits, vegetables, fiber, calcium, iron, and 1 mg of folate supplement per day (for females capable of pregnancy).   [x]      Stressed the importance of regular exercise.    [x]     Substance Abuse: Discussed cessation/primary prevention of tobacco, alcohol, or other drug use; driving or other dangerous activities under the influence; availability of treatment for abuse.    [x]      Injury prevention: Discussed safety belts, safety helmets, smoke detector, smoking near bedding or upholstery.    [x]      Sexuality: Discussed sexually transmitted diseases, partner selection, use of condoms, avoidance of unintended pregnancy  and contraceptive alternatives.    [x]     Dental health: Discussed importance of regular tooth brushing, flossing, and dental visits.   [x]      Health maintenance and immunizations reviewed. Please refer to Health maintenance section.   CMA or LPN served as scribe during this visit. History, Physical, and Plan performed by medical provider. The above documentation has been reviewed and is accurate and complete.  Jarold Motto, PA-C [facility]

## 2017-12-28 LAB — CBC WITH DIFFERENTIAL/PLATELET
BASOS ABS: 0.1 10*3/uL (ref 0.0–0.1)
Basophils Relative: 1.5 % (ref 0.0–3.0)
EOS ABS: 0 10*3/uL (ref 0.0–0.7)
Eosinophils Relative: 0.6 % (ref 0.0–5.0)
HEMATOCRIT: 40.8 % (ref 36.0–46.0)
Hemoglobin: 13.6 g/dL (ref 12.0–15.0)
LYMPHS ABS: 2.5 10*3/uL (ref 0.7–4.0)
LYMPHS PCT: 29.8 % (ref 12.0–46.0)
MCHC: 33.2 g/dL (ref 30.0–36.0)
MCV: 88.2 fl (ref 78.0–100.0)
MONO ABS: 0.4 10*3/uL (ref 0.1–1.0)
Monocytes Relative: 5.3 % (ref 3.0–12.0)
NEUTROS ABS: 5.3 10*3/uL (ref 1.4–7.7)
NEUTROS PCT: 62.8 % (ref 43.0–77.0)
PLATELETS: 206 10*3/uL (ref 150.0–400.0)
RBC: 4.63 Mil/uL (ref 3.87–5.11)
RDW: 12.8 % (ref 11.5–15.5)
WBC: 8.5 10*3/uL (ref 4.0–10.5)

## 2017-12-28 LAB — COMPREHENSIVE METABOLIC PANEL
ALT: 14 U/L (ref 0–35)
AST: 21 U/L (ref 0–37)
Albumin: 4.7 g/dL (ref 3.5–5.2)
Alkaline Phosphatase: 38 U/L — ABNORMAL LOW (ref 39–117)
BILIRUBIN TOTAL: 0.3 mg/dL (ref 0.2–1.2)
BUN: 13 mg/dL (ref 6–23)
CO2: 30 meq/L (ref 19–32)
CREATININE: 0.74 mg/dL (ref 0.40–1.20)
Calcium: 9.4 mg/dL (ref 8.4–10.5)
Chloride: 101 mEq/L (ref 96–112)
GFR: 98.7 mL/min (ref >60.00)
GLUCOSE: 89 mg/dL (ref 70–99)
Potassium: 3.6 mEq/L (ref 3.5–5.1)
Sodium: 137 mEq/L (ref 135–145)
Total Protein: 7.4 g/dL (ref 6.0–8.3)

## 2017-12-28 LAB — LIPID PANEL
CHOL/HDL RATIO: 3
Cholesterol: 210 mg/dL — ABNORMAL HIGH (ref 0–200)
HDL: 80.6 mg/dL (ref >39.00)
LDL CALC: 116 mg/dL — AB (ref 0–99)
NonHDL: 129.88
Triglycerides: 70 mg/dL (ref 0.0–149.0)
VLDL: 14 mg/dL (ref 0.0–40.0)

## 2017-12-28 LAB — TSH: TSH: 1.29 u[IU]/mL (ref 0.35–4.50)

## 2017-12-30 ENCOUNTER — Encounter: Payer: Self-pay | Admitting: Physician Assistant

## 2018-01-10 ENCOUNTER — Ambulatory Visit
Admission: RE | Admit: 2018-01-10 | Discharge: 2018-01-10 | Disposition: A | Discharge status: Home / Self Care | Payer: BLUE CROSS/BLUE SHIELD | Source: Ambulatory Visit | Attending: Physician Assistant | Admitting: Physician Assistant

## 2018-01-10 DIAGNOSIS — N644 Mastodynia: Secondary | ICD-10-CM

## 2018-01-10 DIAGNOSIS — N6489 Other specified disorders of breast: Secondary | ICD-10-CM | POA: Diagnosis not present

## 2018-01-23 ENCOUNTER — Ambulatory Visit: Payer: BLUE CROSS/BLUE SHIELD | Admitting: Physical Therapy

## 2018-01-29 ENCOUNTER — Ambulatory Visit: Payer: BLUE CROSS/BLUE SHIELD | Admitting: Physical Therapy

## 2018-01-29 ENCOUNTER — Encounter: Payer: Self-pay | Admitting: Physical Therapy

## 2018-01-29 DIAGNOSIS — M546 Pain in thoracic spine: Secondary | ICD-10-CM | POA: Diagnosis not present

## 2018-01-30 ENCOUNTER — Encounter: Payer: Self-pay | Admitting: Physical Therapy

## 2018-01-30 NOTE — Therapy (Addendum)
[facility] [address] [place], [place], [address] Phone: [phone]   Fax:  [phone]  Physical Therapy Evaluation  Patient Details  Name: Norma Garcia MRN: [id number] Date of Birth: 01/30/1991 Referring Provider (PT): Morene Rankins   Encounter Date: 01/29/2018  PT End of Session - 01/29/18 1358    Visit Number  1    Number of Visits  12    Date for PT Re-Evaluation  03/12/18    Authorization Type  [employer]    PT Start Time  1345    PT Stop Time  1430    PT Time Calculation (min)  45 min    Activity Tolerance  Patient tolerated treatment well    Behavior During Therapy  [facility] for tasks assessed/performed       Past Medical History:  Diagnosis Date  . Anxiety   . 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 Headache    occasional migraines during second trimester; none during third trimester  . History of chicken pox   . Hx of urinary tract infection   . Keloid   . Postpartum care following cesarean delivery (4/28) 06/19/2013  . Postpartum care following VBAC delivery (4/27) 06/18/2015    Past Surgical History:  Procedure Laterality Date  . CESAREAN SECTION N/A 06/18/2013   Procedure: CESAREAN SECTION;  Surgeon: Princess Bruins, MD;  Location: [facility];  Service: Obstetrics;  Laterality: N/A;  . CESAREAN SECTION  2015    There were no vitals filed for this visit.   Subjective Assessment - 01/29/18 1352    Subjective  Pt states increased pain in back/thoracic region. 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 Likes to run, but has not in last month or so. She states increased pain for months, no injury to report, but feels that her holding small children, and posture is effecting this.  Currently not doing any exercises now.     Currently in Pain?  Yes    Pain Score  5     Pain Location  Back    Pain Orientation  Left;Mid    Pain Type  Chronic pain    Pain Onset  More than a month ago    Pain Frequency  Intermittent    Aggravating Factors   Lifting, carrying, child care.          [facility] PT Assessment - 01/30/18 0001      Assessment   Medical Diagnosis  Thoracic back pain    Referring Provider (PT)  Morene Rankins    Prior Therapy  no      Precautions   Precautions  None      Balance Screen   Has the patient fallen in the past 6 months  No      Prior Function   Level of Independence  Independent      Cognition   Overall Cognitive Status  Within Functional Limits for tasks assessed      Posture/Postural Control   Posture Comments  Slight L trunk SB, R LE shorter in supine;       ROM / Strength   AROM / PROM / Strength  AROM;Strength      AROM   Overall AROM Comments  Lumbar: WFL; Tightness with R SB: Hips: WNL;       Strength   Overall Strength Comments  Hips: 4-/5;  Core: 3/5;       Palpation   Palpation comment  Significant spasm and hypertonicity of L thoracic paraspinal, painful to  palpate.       Special Tests   Other special tests  Neg SLR; No pain with spring testing of vertebrae;  Increased tension in paraspinals, but otherwise pt hypermobile.                 Objective measurements completed on examination: See above findings.      [facility] PT Treatment/Exercise - 01/30/18 0001      Exercises   Exercises  Lumbar      Lumbar Exercises: Stretches   Single Knee to Chest Stretch  3 reps;30 seconds    Other Lumbar Stretch Exercise  Childs pose 30 sec x3 L/R/Center    Other Lumbar Stretch Exercise  Standing QL stretch 30 sec x2 bil;              PT Education - 01/30/18 1222    Education Details  PT POC, HEP    Person(s) Educated  Patient    Methods  Explanation;Demonstration;Verbal cues;Handout    Comprehension  Verbalized understanding;Need further instruction       PT Short Term Goals - 01/30/18 1225      PT SHORT TERM GOAL #1   Title  Pt to be independent with initial HEp     Time  2    Period  Weeks    Status  New    Target Date  02/13/18       PT SHORT TERM GOAL #2   Title  Pt to report decreased pain in thoracic region, to 0-/4/10     Time  2    Period  Weeks    Status  New    Target Date  02/13/18        PT Long Term Goals - 01/30/18 1226      PT LONG TERM GOAL #1   Title  Pt to be independent with final HEP and able to demo proper lifting technique for lifting/squatting.     Time  6    Period  Weeks    Status  New    Target Date  03/13/18      PT LONG TERM GOAL #2   Title  Pt to report decreased pain in L thoracic region, to 0-2/10 with activity and child care.     Time  6    Period  Weeks    Status  New    Target Date  03/13/18      PT LONG TERM GOAL #3   Title  Pt to demo improved strength of hips and core to be at least 4+/5, with minimal postural changes seen with stabilization exercises, to improve pain and stability.     Time  6    Period  Weeks    Status  New    Target Date  03/13/18      PT LONG TERM GOAL #4   Title  Pt to demo decrease soft tissue restrictions of L thoracic region ,to be [facility] , to improve pain and mobility.     Time  6    Period  Weeks    Status  New    Target Date  03/13/18             Plan - 01/30/18 1230    Clinical Impression Statement  Pt presents with primary complaint of increased pain in L thoracic region. She has significant muscle spasm and hypertonicity of L paraspinals vs R., and pain with palpation of this.  She has mild postural  deficts, with mild scoliosis, and mild trunk SB to L. She has poor lifting mechanics, and has difficulty with child care and carrying. Pt with weakness in core and hips, and will benefit from education on HEP. Pt will likely benefit from dry needling, due to amount of muscle spasm that is present at paraspinals.  Pt with decreased ability for full functional activities, due to pain, and will benefit form skilled care to improve.     Clinical Presentation  Stable    Clinical Decision Making  Low    Rehab Potential  Good    PT Frequency   2x / week    PT Duration  6 weeks    PT Treatment/Interventions  ADLs/Self Care Home Management;Cryotherapy;Electrical Stimulation;Iontophoresis 31m/ml Dexamethasone;Moist Heat;Therapeutic activities;Functional mobility training;Stair training;Gait training;Ultrasound;Traction;Therapeutic exercise;Balance training;Neuromuscular re-education;Patient/family education;Dry needling;Passive range of motion;Manual techniques;Taping;Spinal Manipulations;Joint Manipulations    Consulted and Agree with Plan of Care  Patient       Patient will benefit from skilled therapeutic intervention in order to improve the following deficits and impairments:  Abnormal gait, Hypomobility, Decreased strength, Pain, Increased muscle spasms, Improper body mechanics  Visit Diagnosis: Pain in thoracic spine     Problem List Patient Active Problem List   Diagnosis Date Noted  . Previous cesarean delivery, antepartum 06/18/2015  . Encounter for trial of labor 06/18/2015  . Postpartum care following VBAC delivery (4/27) 06/18/2015    LLyndee Hensen PT, DPT 12:42 PM  01/30/18    [place] [address]Phone: [phone]  Fax:  [phone] Name: ASHANEY DECKMANMRN: [id number] of Birth: [birth year]  PHYSICAL THERAPY DISCHARGE SUMMARY  Visits from Start of Care:1   Plan: Patient agrees to discharge.  Patient goals were not met. Patient is being discharged due to not returning since the last visit.  ?????       LLyndee Hensen PT, DPT 11:40 AM  05/03/18

## 2018-01-30 NOTE — Patient Instructions (Signed)
Standing QL at wall  Childs pose L/R/center [facility] 30 sec x3 bil;

## 2018-02-05 ENCOUNTER — Encounter: Payer: BLUE CROSS/BLUE SHIELD | Admitting: Physical Therapy

## 2018-07-22 ENCOUNTER — Encounter: Payer: Self-pay | Admitting: Physician Assistant

## 2018-07-23 ENCOUNTER — Ambulatory Visit (INDEPENDENT_AMBULATORY_CARE_PROVIDER_SITE_OTHER): Payer: BLUE CROSS/BLUE SHIELD | Admitting: Physician Assistant

## 2018-07-23 ENCOUNTER — Telehealth: Payer: Self-pay

## 2018-07-23 ENCOUNTER — Telehealth: Payer: Self-pay | Admitting: Physician Assistant

## 2018-07-23 ENCOUNTER — Other Ambulatory Visit: Payer: BLUE CROSS/BLUE SHIELD

## 2018-07-23 ENCOUNTER — Encounter: Payer: Self-pay | Admitting: Physician Assistant

## 2018-07-23 VITALS — Wt 115.0 lb

## 2018-07-23 DIAGNOSIS — R51 Headache: Secondary | ICD-10-CM

## 2018-07-23 DIAGNOSIS — Z20828 Contact with and (suspected) exposure to other viral communicable diseases: Secondary | ICD-10-CM

## 2018-07-23 DIAGNOSIS — R509 Fever, unspecified: Secondary | ICD-10-CM | POA: Diagnosis not present

## 2018-07-23 DIAGNOSIS — Z7189 Other specified counseling: Secondary | ICD-10-CM | POA: Diagnosis not present

## 2018-07-23 DIAGNOSIS — Z20822 Contact with and (suspected) exposure to covid-19: Secondary | ICD-10-CM

## 2018-07-23 DIAGNOSIS — R197 Diarrhea, unspecified: Secondary | ICD-10-CM | POA: Diagnosis not present

## 2018-07-23 DIAGNOSIS — R6889 Other general symptoms and signs: Secondary | ICD-10-CM | POA: Diagnosis not present

## 2018-07-23 NOTE — Telephone Encounter (Signed)
Ms. Bufford Buttner request COVID 19 test.

## 2018-07-23 NOTE — Progress Notes (Signed)
Virtual Visit via Video   I connected with Barton Fannyndressa J Bowsher on 07/23/18 at  1:40 PM EDT by a video enabled telemedicine application and verified that I am speaking with the correct person using two identifiers. Location patient: Home Location provider: [facility], Office Persons participating in the virtual visit: Barton Fannyndressa J Minteer, Jarold MottoSamantha Raheem Kolbe PA-C  I discussed the limitations of evaluation and management by telemedicine and the availability of in person appointments. The patient expressed understanding and agreed to proceed.   Subjective:   HPI:   Temp of 101.3 yesterday, mid 99's today. Diarrhea and chills. Slight headache. Husband has most of the same symptoms. Eating and drinking well. Had some nausea and vomiting yesterday but resolved.   Denies: bloody diarrhea, bloody emesis, cough, SOB, chest pain, lightheadedness, dizziness   Children at home are without symptoms.  ROS: See pertinent positives and negatives per HPI.  Patient Active Problem List   Diagnosis Date Noted  . Previous cesarean delivery, antepartum 06/18/2015  . Encounter for trial of labor 06/18/2015  . Postpartum care following VBAC delivery (4/27) 06/18/2015    Social History   Tobacco Use  . Smoking status: Never Smoker  . Smokeless tobacco: Never Used  Substance Use Topics  . Alcohol use: Yes    Frequency: Never    Comment: socially    Current Outpatient Medications:  .  ascorbic acid (VITAMIN C) 250 MG tablet, Take 250 mg by mouth daily., Disp: , Rfl:  .  ibuprofen (ADVIL,MOTRIN) 200 MG tablet, Take 400 mg as needed by mouth., Disp: , Rfl:  .  Multiple Vitamin (MULTIVITAMIN) tablet, Take 1 tablet by mouth daily., Disp: , Rfl:  .  PARAGARD INTRAUTERINE COPPER IU, by Intrauterine route. IUD was placed 07/2015 by GYN, needs to be removed in 07/2025., Disp: , Rfl:   Allergies  Allergen Reactions  . Reglan [Metoclopramide] Anxiety    Objective:   VITALS: Per patient if applicable,  see vitals. GENERAL: Alert, appears well and in no acute distress. HEENT: Atraumatic, conjunctiva clear, no obvious abnormalities on inspection of external nose and ears. NECK: Normal movements of the head and neck. CARDIOPULMONARY: No increased WOB. Speaking in clear sentences. I:E ratio WNL.  MS: Moves all visible extremities without noticeable abnormality. PSYCH: Pleasant and cooperative, well-groomed. Speech normal rate and rhythm. Affect is appropriate. Insight and judgement are appropriate. Attention is focused, linear, and appropriate.  NEURO: CN grossly intact. Oriented as arrived to appointment on time with no prompting. Moves both UE equally.  SKIN: No obvious lesions, wounds, erythema, or cyanosis noted on face or hands.  Assessment and Plan:   Lisabeth Registerndressa was seen today for abdominal pain.  Diagnoses and all orders for this visit:  Suspected Covid-19 Virus Infection; Advice Given About Covid-19 Virus Infection  No red flags on visual exam or via discussion with patient. Encouraged continued adequate fluid intake.  Patient was instructed as follows:  Given the current COVID-19 outbreak, it is my professional recommendation that for your symptoms you should self-isolate for at least 7 days from when your symptoms started. Please do not return to work until you are fever free for at least 3 days without medications and significant improvement of your symptoms.  If you develop severe shortness of breath, uncontrolled fevers, coughing up blood, confusion, chest pain, or signs of dehydration (such as significantly decreased urine amounts or dizziness with standing) please CALL the ER and then GO to the ER.  Patient requesting COVID-19 testing. I will send  telephone encounter for testing to [facility] for them to contact and arrange with patient.   . Reviewed expectations re: course of current medical issues. . Discussed self-management of symptoms. . Outlined signs and  symptoms indicating need for more acute intervention. . Patient verbalized understanding and all questions were answered. Marland Kitchen Health Maintenance issues including appropriate healthy diet, exercise, and smoking avoidance were discussed with patient. . See orders for this visit as documented in the electronic medical record.  I discussed the assessment and treatment plan with the patient. The patient was provided an opportunity to ask questions and all were answered. The patient agreed with the plan and demonstrated an understanding of the instructions.   The patient was advised to call back or seek an in-person evaluation if the symptoms worsen or if the condition fails to improve as anticipated.   [place], [place] 07/23/2018

## 2018-07-23 NOTE — Telephone Encounter (Signed)
Please call and schedule COVID-19 outpatient testing ASAP.  Jarold Motto PA-C

## 2018-07-24 LAB — NOVEL CORONAVIRUS, NAA: SARS-CoV-2, NAA: NOT DETECTED

## 2018-07-25 ENCOUNTER — Telehealth: Payer: Self-pay | Admitting: Physician Assistant

## 2018-07-25 ENCOUNTER — Encounter: Payer: Self-pay | Admitting: Physician Assistant

## 2018-07-25 ENCOUNTER — Ambulatory Visit (INDEPENDENT_AMBULATORY_CARE_PROVIDER_SITE_OTHER): Payer: BLUE CROSS/BLUE SHIELD | Admitting: Physician Assistant

## 2018-07-25 VITALS — Temp 96.0°F | Ht 60.0 in | Wt 117.0 lb

## 2018-07-25 DIAGNOSIS — F419 Anxiety disorder, unspecified: Secondary | ICD-10-CM | POA: Diagnosis not present

## 2018-07-25 MED ORDER — HYDROXYZINE HCL 25 MG PO TABS: 25.0000 mg | ORAL_TABLET | Freq: Every evening | ORAL | 0 refills | Status: DC | PRN

## 2018-07-25 NOTE — Telephone Encounter (Signed)
Patient is scheduled for today at 11:20am for virtual visit

## 2018-07-25 NOTE — Telephone Encounter (Signed)
Noted  

## 2018-07-25 NOTE — Telephone Encounter (Signed)
Copied from CRM [id number]. Topic: General - Inquiry >> Jul 25, 2018 10:19 AM Deborha Payment wrote: Reason for CRM: patient would like to talk to nurse or PCP or make another appt.  Patient is not feeling well. Patient believes she is experiencing anxiety.  Patient would like a call back # [phone]

## 2018-07-25 NOTE — Telephone Encounter (Signed)
Please schedule virtual visit as soon as possible

## 2018-07-25 NOTE — Progress Notes (Signed)
Virtual Visit via Video   I connected with Meenu Freund Check on 07/25/18 at 11:20 AM EDT by a video enabled telemedicine application and verified that I am speaking with the correct person using two identifiers. Location patient: Home Location provider: [facility], Office Persons participating in the virtual visit: Leta Landrith Spurgeon, Jarold Motto PA-C  I discussed the limitations of evaluation and management by telemedicine and the availability of in person appointments. The patient expressed understanding and agreed to proceed.   Subjective:   HPI:   Anxiety Was seen by me on 07/23/2018 for COVID-19 symptoms. She had a nasal swab performed and it was negative, she found out this morning the results. Yesterday before she went to bed she felt SOB for a few minutes. She doesn't have any other symptoms. She denies fever, cough, runny nose. She had similar sx on Sunday but on Sunday she also had vomiting, diarrhea, fever, and body aches. She has occasional palpitations.  She has never taken medications before. Denies SI/HI.  GAD 7 : Generalized Anxiety Score 07/25/2018  Nervous, Anxious, on Edge 2  Control/stop worrying 2  Worry too much - different things 2  Trouble relaxing 1  Restless 2  Easily annoyed or irritable 3  Afraid - awful might happen 3  Total GAD 7 Score 15  Anxiety Difficulty Very difficult   ROS: See pertinent positives and negatives per HPI.  Patient Active Problem List   Diagnosis Date Noted  . Previous cesarean delivery, antepartum 06/18/2015  . Encounter for trial of labor 06/18/2015  . Postpartum care following VBAC delivery (4/27) 06/18/2015    Social History   Tobacco Use  . Smoking status: Never Smoker  . Smokeless tobacco: Never Used  Substance Use Topics  . Alcohol use: Yes    Frequency: Never    Comment: socially    Current Outpatient Medications:  .  ascorbic acid (VITAMIN C) 250 MG tablet, Take 250 mg by mouth daily., Disp: , Rfl:  .   ibuprofen (ADVIL,MOTRIN) 200 MG tablet, Take 400 mg as needed by mouth., Disp: , Rfl:  .  Multiple Vitamin (MULTIVITAMIN) tablet, Take 1 tablet by mouth daily., Disp: , Rfl:  .  PARAGARD INTRAUTERINE COPPER IU, by Intrauterine route. IUD was placed 07/2015 by GYN, needs to be removed in 07/2025., Disp: , Rfl:  .  hydrOXYzine (ATARAX/VISTARIL) 25 MG tablet, Take 1 tablet (25 mg total) by mouth at bedtime as needed for anxiety (insomnia). Take one 25 mg tablet 30-60 minutes prior to bedtime for insomnia, anxiety. May increase to two tablets., Disp: 30 tablet, Rfl: 0  Allergies  Allergen Reactions  . Reglan [Metoclopramide] Anxiety    Objective:   VITALS: Per patient if applicable, see vitals. GENERAL: Alert, appears well and in no acute distress. HEENT: Atraumatic, conjunctiva clear, no obvious abnormalities on inspection of external nose and ears. NECK: Normal movements of the head and neck. CARDIOPULMONARY: No increased WOB. Speaking in clear sentences. I:E ratio WNL.  MS: Moves all visible extremities without noticeable abnormality. PSYCH: Pleasant and cooperative, well-groomed. Speech normal rate and rhythm. Affect is appropriate. Insight and judgement are appropriate. Attention is focused, linear, and appropriate.  NEURO: CN grossly intact. Oriented as arrived to appointment on time with no prompting. Moves both UE equally.  SKIN: No obvious lesions, wounds, erythema, or cyanosis noted on face or hands.  Assessment and Plan:   Moniesha was seen today for anxiety.  Diagnoses and all orders for this visit:  Anxiety  New. Currently uncontrolled. Will trial Atarax as needed. Discussed worsening precautions. Denies SI/HI. Follow-up in 1 week. Encouraged her to try to work on self-care as able.  Other orders -     hydrOXYzine (ATARAX/VISTARIL) 25 MG tablet; Take 1 tablet (25 mg total) by mouth at bedtime as needed for anxiety (insomnia). Take one 25 mg tablet 30-60 minutes prior to bedtime  for insomnia, anxiety. May increase to two tablets.  . Reviewed expectations re: course of current medical issues. . Discussed self-management of symptoms. . Outlined signs and symptoms indicating need for more acute intervention. . Patient verbalized understanding and all questions were answered. Marland Kitchen. Health Maintenance issues including appropriate healthy diet, exercise, and smoking avoidance were discussed with patient. . See orders for this visit as documented in the electronic medical record.  I discussed the assessment and treatment plan with the patient. The patient was provided an opportunity to ask questions and all were answered. The patient agreed with the plan and demonstrated an understanding of the instructions.   The patient was advised to call back or seek an in-person evaluation if the symptoms worsen or if the condition fails to improve as anticipated.   [place]waterSamantha Jazzmine Kleiman, [place]PA 07/25/2018

## 2018-08-01 ENCOUNTER — Other Ambulatory Visit: Payer: Self-pay

## 2018-08-01 ENCOUNTER — Encounter: Payer: Self-pay | Admitting: Obstetrics and Gynecology

## 2018-08-01 ENCOUNTER — Telehealth: Payer: Self-pay | Admitting: Obstetrics and Gynecology

## 2018-08-01 ENCOUNTER — Ambulatory Visit: Payer: BC Managed Care – PPO | Admitting: Obstetrics and Gynecology

## 2018-08-01 VITALS — BP 112/70 | HR 72 | Temp 98.0°F | Resp 14 | Ht 59.0 in | Wt 119.0 lb

## 2018-08-01 DIAGNOSIS — N946 Dysmenorrhea, unspecified: Secondary | ICD-10-CM | POA: Diagnosis not present

## 2018-08-01 DIAGNOSIS — Z30431 Encounter for routine checking of intrauterine contraceptive device: Secondary | ICD-10-CM

## 2018-08-01 DIAGNOSIS — F419 Anxiety disorder, unspecified: Secondary | ICD-10-CM

## 2018-08-01 DIAGNOSIS — F410 Panic disorder [episodic paroxysmal anxiety] without agoraphobia: Secondary | ICD-10-CM | POA: Diagnosis not present

## 2018-08-01 DIAGNOSIS — R102 Pelvic and perineal pain: Secondary | ICD-10-CM

## 2018-08-01 LAB — POCT URINE PREGNANCY: Preg Test, Ur: NEGATIVE

## 2018-08-01 MED ORDER — VENLAFAXINE HCL ER 37.5 MG PO CP24: 37.5000 mg | ORAL_CAPSULE | Freq: Every day | ORAL | 1 refills | Status: DC

## 2018-08-01 NOTE — Progress Notes (Signed)
GYNECOLOGY  VISIT   HPI: 28 y.o.   Married  [place]  female   [phone] with Patient's last menstrual period was 07/26/2018.   here as a new patient for severe pain with periods and ovulation. Patient has Paragard IUD since June 2017.  She is worried about her IUD causing pain.   For 2 months she has pain and swelling 2 weeks prior to ovulation. One week prior to her menstrual cycle, she starts having strong cramping again.  Uses heat, NSAIDs, and has to sit and rest when the pain occurs.   Her flow is heavy for 2 days.  Last 4 - 5 days in total.   No pelvic US to check her ParaGard. No problems with the insertion.  She likes the idea of being hormone free.   Previously had a Mirena IUD.  She had a bad experience and it had to be placed with US guidance.  She had it removed after one year because she a lot of breakthrough bleeding and PMS.  She never really had a "good period" with the IUD in place.   She had a lot of migraine HA when she used birth control in past.  Was having migraine with aura.   Her husband is potentially considering vasectomy.   She report to me that she is having right breast pain during her menstrual period. Not breast feeding.  States she has had a breast US which was normal.  She was told to reduce her caffeine.   Also asking about medication for panic attacks.  She has had this three times in her life.  Currently she is experiencing more stress during Covid.  2 small children and her husband working at home full time.  She was prescribed Vistaril by her PCP.  She does not want to feel tired, and so she does not want to take the Vistaril.  She contacted her provider in [place] who suggested Venlafaxine.  She denies depression and suicidal ideation.  She is asking for my assistance to prescribe.  GYNECOLOGIC HISTORY: Patient's last menstrual period was 07/26/2018. Contraception:  Paragard IUD Menopausal hormone therapy:  n/a Last mammogram:   n/a Last pap smear:   08/15/17 Negative        OB History    Gravida  2   Para  2   Term  2   Preterm  0   AB  0   Living  2     SAB  0   TAB  0   Ectopic  0   Multiple      Live Births  2              Patient Active Problem List   Diagnosis Date Noted  . Previous cesarean delivery, antepartum 06/18/2015  . Encounter for trial of labor 06/18/2015  . Postpartum care following VBAC delivery (4/27) 06/18/2015    Past Medical History:  Diagnosis Date  . Anxiety   . 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 Headache    occasional migraines during second trimester; none during third trimester  . History of chicken pox   . Hx of urinary tract infection   . Keloid   . Postpartum care following cesarean delivery (4/28) 06/19/2013  . Postpartum care following VBAC delivery (4/27) 06/18/2015    Past Surgical History:  Procedure Laterality Date  . CESAREAN SECTION N/A 06/18/2013   Procedure: CESAREAN SECTION;  Surgeon: Princess Bruins, MD;  Location: [facility];  Service: Obstetrics;  Laterality:  N/A;  . CESAREAN SECTION  2015    Current Outpatient Medications  Medication Sig Dispense Refill  . ascorbic acid (VITAMIN C) 250 MG tablet Take 250 mg by mouth daily.    . hydrOXYzine (ATARAX/VISTARIL) 25 MG tablet Take 1 tablet (25 mg total) by mouth at bedtime as needed for anxiety (insomnia). Take one 25 mg tablet 30-60 minutes prior to bedtime for insomnia, anxiety. May increase to two tablets. 30 tablet 0  . ibuprofen (ADVIL,MOTRIN) 200 MG tablet Take 400 mg as needed by mouth.    . Multiple Vitamin (MULTIVITAMIN) tablet Take 1 tablet by mouth daily.    Marland Kitchen. PARAGARD INTRAUTERINE COPPER IU by Intrauterine route. IUD was placed 07/2015 by GYN, needs to be removed in 07/2025.     No current facility-administered medications for this visit.      ALLERGIES: Reglan [metoclopramide]  Family History  Problem Relation Age of Onset  . Hypertension Father   . Hyperlipidemia  Father   . Thyroid nodules Sister   . Diabetes Paternal Grandmother   . Breast cancer Neg Hx   . Colon cancer Neg Hx     Social History   Socioeconomic History  . Marital status: Married    Spouse name: Not on file  . Number of children: Not on file  . Years of education: Not on file  . Highest education level: Not on file  Occupational History  . Not on file  Social Needs  . Financial resource strain: Not on file  . Food insecurity:    Worry: Not on file    Inability: Not on file  . Transportation needs:    Medical: Not on file    Non-medical: Not on file  Tobacco Use  . Smoking status: Never Smoker  . Smokeless tobacco: Never Used  Substance and Sexual Activity  . Alcohol use: Yes    Frequency: Never    Comment: socially  . Drug use: No  . Sexual activity: Yes    Partners: Male    Birth control/protection: I.U.D.    Comment: Paragard June 2017  Lifestyle  . Physical activity:    Days per week: Not on file    Minutes per session: Not on file  . Stress: Not on file  Relationships  . Social connections:    Talks on phone: Not on file    Gets together: Not on file    Attends religious service: Not on file    Active member of club or organization: Not on file    Attends meetings of clubs or organizations: Not on file    Relationship status: Not on file  . Intimate partner violence:    Fear of current or ex partner: Not on file    Emotionally abused: Not on file    Physically abused: Not on file    Forced sexual activity: Not on file  Other Topics Concern  . Not on file  Social History Narrative   ** Merged History Encounter **       From [place], moved here 4 years ago 2 children Married Husband works, she is a Location managerTAHM    Review of Systems  Constitutional: Negative.   HENT: Negative.   Eyes: Negative.   Respiratory: Negative.   Cardiovascular: Negative.   Gastrointestinal: Negative.   Endocrine: Negative.   Genitourinary:       Painful periods   Musculoskeletal: Negative.   Skin: Negative.   Allergic/Immunologic: Negative.   Neurological: Negative.   Hematological: Negative.  Psychiatric/Behavioral: Negative.     PHYSICAL EXAMINATION:    BP 112/70 (BP Location: Right Arm, Patient Position: Sitting, Cuff Size: Normal)   Pulse 72   Temp 98 F (36.7 C) (Temporal)   Resp 14   Ht 4\' 11"  (1.499 m)   Wt 119 lb (54 kg)   LMP 07/26/2018   BMI 24.04 kg/m     General appearance: alert, cooperative and appears stated age Head: Normocephalic, without obvious abnormality, atraumatic Lungs: clear to auscultation bilaterally Heart: regular rate and rhythm Abdomen: soft, non-tender, no masses,  no organomegaly Extremities: extremities normal, atraumatic, no cyanosis or edema No abnormal inguinal nodes palpated Neurologic: Grossly normal  Pelvic: External genitalia:  no lesions              Urethra:  normal appearing urethra with no masses, tenderness or lesions              Bartholins and Skenes: normal                 Vagina: normal appearing vagina with normal color and discharge, no lesions              Cervix: no lesions. IUD strings seen.                Bimanual Exam:  Uterus:  normal size, contour, position, consistency, mobility, non-tender              Adnexa: no mass, fullness, tenderness          Chaperone was present for exam.  ASSESSMENT  Migraine with aura.  Dysmenorrhea.  Pelvic pain. Ovulatory pain.  ParaGard IUD.  Anxiety with panic.  PLAN  UPT - negative.  We discussed potential reasons for her pain - malpositioned IUD, endometriosis, ovarian cysts, infection.  NSAIDs for pain.  She will do Aleve or Ibuprofen OTC.  Declines Rx.  Return for pelvic US.  She is considering IUD removal.  We discussed anxiety and options for tx. Start Effexor 37.5 mg daily.  Instructed in use and side effects.  Annual exam and recheck in 6 weeks.   An After Visit Summary was printed and given to the  patient.  __45____ minutes face to face time of which over 50% was spent in counseling.

## 2018-08-01 NOTE — Telephone Encounter (Signed)
Call placed to patient to schedule recommended ultrasound appointment. Left voicemail message requesting a return call

## 2018-08-01 NOTE — Patient Instructions (Addendum)
Dismenorreia Dysmenorrhea  Dismenorreia se refere a clicas causadas pelo retesamento (contrao) do tero durante um perodo menstrual. A dismenorreia pode ser leve ou ser suficientemente intensa para atrapalhar as atividades do dia a dia por alguns dias todos os meses. A dismenorreia primria consiste em clicas menstruais que duram poucos dias, no incio do perodo menstrual ou logo em seguida. Manifesta-se geralmente aps uma adolescente comear a entrar em seus ciclos.  medida que a mulher envelhece ou tem um filho, as clicas normalmente diminuem ou desaparecem. A dismenorreia secundria comea mais tarde na vida e  causada por um distrbio do sistema reprodutivo. Ela dura mais tempo e pode causar mais dor do que a dismenorreia primria. A dor pode comear antes do perodo menstrual e durar at SUPERVALU INCalguns dias aps o fim do perodo menstrual. Quais so as causas? A dismenorreia geralmente  causada por um problema subjacente como:  Crescimento do tecido que reveste o tero (endomtrio) fora do tero, em outras partes do corpo (endometriose).  Crescimento de tecido endometrial nas paredes musculares do tero (adenomiose).  Preenchimento de vasos sanguneos da pelve com sangue logo antes do perodo menstrual (sndrome da congesto plvica).  Crescimento exagerado de clulas (plipos) no endomtrio ou na parte inferior do tero (colo do tero).  Linna HoffQueda do tero para Counsellordentro da vagina (prolapso) em decorrncia do estiramento ou enfraquecimento de msculos.  Problemas da bexiga, como infeco ou inflamao.  Problemas intestinais, como tumor ou sndrome do intestino irritvel.  Cncer dos rgos reprodutivos ou da bexiga.  tero gravemente deslocado.  Colo do tero fechado ou com abertura muito pequena.  Tumores no cancerosos (benignos) no tero (miomas).  Doena inflamatria plvica (DIP).  Formao de cicatrizes plvicas (adeses) decorrentes de Lesothocirurgia prvia.  Um cisto  ovariano.  Um DIU (dispositivo intrauterino). O que aumenta o risco? Voc pode ter maior probabilidade de manifestar essa doena se:  Tiver menos de 30 anos.  Tiver entrado cedo na puberdade.  Tiver menstruao intensa ou irregular.  Nunca tiver dado  Patent examinerluz.  Voc tiver um histrico familiar de dismenorreia.  Fumar. Quais so os sinais ou sintomas? Os sintomas desse quadro clnico incluem:  Clicas, dor pulsante ou sensao de preenchimento na parte inferior do abdome.  Dor na lombar.  Menstruaes que duram mais de 7 dias.  Dores de cabea.  Inchao.  Fadiga.  Enjoo ou vmito.  Diarreia.  Sudorese ou Mexicotontura.  Fezes amolecidas. Como esse quadro clnico  diagnosticado? Esse quadro clnico pode ser diagnosticado com base:  Seus sintomas.  Seu histrico mdico.  Um exame fsico.  Exames de sangue.  Exame de Papanicolau. Esse  um exame no qual clulas do colo do tero so testadas em busca de sinais de cncer ou infeco.  Teste de gravidez.  Exames por imagens, tais como: ? Ultrassom. ? Um procedimento para remover e examinar uma amostra de tecido endometrial (dilatao e curetagem ou D&C). ? Um procedimento para examinar visualmente a parte interna:  Do tero (histeroscopia).  Do abdome ou da pelve (laparoscopia).  Da bexiga (cistoscopia).  Do intestino (colonoscopia).  Do estmago (gastroscopia). ? Radiografias. ? Tomografia computadorizada (TC). ? Ressonncia magntica (RM). Como esse quadro clnico  tratado? O tratamento depender da causa da dismenorreia. O tratamento pode incluir:  Medicamentos analgsicos prescritos pelo seu mdico.  Plulas contraceptivas contendo o hormnio progesterona.  Um DIU contendo o hormnio progesterona.  Medicamentos para controlar o sangramento.  Terapia de reposio hormonal.  AINEs (anti-inflamatrios no esteroides). Os AINEs podem ajudar a interromper a produo  de hormnios que causam  clicas.  Medicamentos antidepressivos.  Cirurgia para remover adeses, endometriose, cistos ovarianos, miomas ou o tero inteiro (histerectomia).  Injees de progesterona para interromper o perodo menstrual.  Um procedimento para destruir o endomtrio (ablao do endomtrio).  Um procedimento que corta os nervos na parte inferior da coluna (sacro) que vo para os rgos reprodutivos (neurectomia pr-sacral).  Um procedimento para aplicar uma corrente eltrica nos nervos do sacro (estimulao do nervo sacro).  Exerccios e fisioterapia.  Meditao e terapia com ioga.  Acupuntura. Colabore com seu mdico no sentido de Chief Strategy Officerdeterminar qual tratamento ou combinao de tratamentos  melhor para voc. Siga essas instrues em casa: WriterAlvio da dor e das clicas  Aplique calor  coluna lombar ou abdome quando sentir dor ou clicas. Use a fonte de calor que seu mdico recomendar, como uma compressa quente ou uma almofada trmica. ? Coloque uma toalha entre a pele e a fonte de calor. ? Deixe o calor atuar por 20-30 minutos. ? Remova a fonte de calor caso a pele fique vermelha clara. Isso ser especialmente importante se voc no conseguir sentir dor, calor ou frio. Voc poder correr risco maior de sofrer Entergy Corporationuma queimadura. ? No durma com uma almofada trmica ligada.  Fazer exerccios aerbicos, como caminhar, Programmer, systemsnadar ou andar de Scientist, research (physical sciences)bicicleta. Isso pode ajudar a Paramedicaliviar as clicas.  Massageie a coluna lombar ou abdome para ajudar a Paramedicaliviar a dor. Instrues gerais  Tome medicamentos vendidos com ou sem receita mdica somente de acordo com as indicaes do seu mdico.  No dirija nem use mquinas pesadas enquanto estiver tomando analgsicos vendidos com receita mdica.  Evite lcool e cafena durante e logo antes do seu perodo menstrual. A ingesto deles pode piorar as clicas.  No use nenhum produto que contenha nicotina ou tabaco, como cigarros e cigarros eletrnicos. Caso precise de ajuda  para parar de fumar, fale com seu mdico.  Comparea a todas as consultas de acompanhamento de acordo com as orientaes do seu mdico. Isso  importante. Entre em contato com um mdico se:  Sentir dor crescente ou que no melhora com medicamentos.  Sentir dor durante o sexo.  Desenvolver nuseas ou vmitos em seu perodo menstrual e no conseguir controlar com medicamentos. Tomasa Hosebtenha ajuda imediatamente se:  Desmaiar. Resumo  Dismenorreia se refere a clicas causadas pelo retesamento (contrao) do tero durante um perodo menstrual.  A dismenorreia pode ser leve ou ser suficientemente intensa para atrapalhar as atividades do dia a dia por alguns dias todos os meses.  O tratamento depender da causa da dismenorreia.  Colabore com seu mdico no sentido de Chief Strategy Officerdeterminar qual tratamento ou combinao de tratamentos  melhor para voc. Estas informaes no se destinam a substituir as recomendaes de seu mdico. No deixe de discutir quaisquer dvidas com seu mdico. Document Released: 03/06/2015 Document Revised: 06/09/2016 Document Reviewed: 06/09/2016 Elsevier Interactive Patient Education  2019 Elsevier Inc.   Venlafaxine extended-release capsules O que  este medicamento? A VENLAFAXINA  usada no tratamento da depresso, da ansiedade e da sndrome do pnico. Este medicamento pode ser usado para outros propsitos; em caso de dvidas, pergunte ao seu profissional de sade ou farmacutico. NOMES DE MARCAS COMUNS: Effexor XR O que devo dizer a meu profissional de sade antes de tomar este medicamento? Precisam saber se voc tem algum dos seguintes problemas ou estados de sade: -problemas de sangramento -glaucoma -doenas cardacas -presso alta -colesterol alto -doenas renais -doenas hepticas -nveis baixos de sdio no sangue -mania ou transtorno bipolar -convulses (crises convulsivas) -  pensamentos, planos suicidas ou tentativas de suicdio; tentativa anterior de suicdio  (sua ou de um parente) -est tomando medicamentos para tratar ou prevenir cogulos (trombose) -Investment banker, operational tiroide -reao estranha ou alergia  venlafaxina ou  desvenlafaxina -reao estranha ou alergia a outros medicamentos, alimentos, corantes ou conservantes -est grvida ou tentando Hospital doctor -est amamentando Como devo usar este medicamento? [employer] medicamento por via oral com um copo cheio d'gua. Siga as instrues na embalagem ou na bula. No parta, esmague ou [employer]. Tome este medicamento com comida. Se necessrio, a cpsula pode ser aberta cuidadosamente e seu contedo espalhado sobre uma colher cheia de pur de ma frio. Engula imediatamente a mistura do pur de ma/cpsula sem mastigar, e em seguida tome um copo de gua para garantir a ingesto completa do contedo Warehouse manager. Tome este medicamento sempre no mesmo horrio, todos os dias. No tome este medicamento com frequncia maior do que a indicada. No pare de tomar [employer] subitamente, exceto sob aconselhamento mdico. Interromper o uso desta medicao muito rapidamente pode causar efeitos secundrios graves ou agravar a sua condio. O farmacutico lhe dar um folheto informativo especial a cada compra do medicamento. No se esquea de ler atentamente essas informaes todas as vezes. Fale com seu pediatra a respeito do uso deste medicamento em crianas. Pode ser preciso tomar alguns cuidados especiais. Superdosagem: Se achar que tomou uma superdosagem deste medicamento, entre em contato imediatamente com o [facility] ou v a [employer]. OBSERVAO: Este medicamento  s para voc. No compartilhe este medicamento com outras pessoas. E se eu deixar de tomar uma dose? Se perder uma dose, tome-a assim que possvel. Se j estiver quase na hora da sua prxima dose, tome somente essa dose. No tome o remdio em dobro, nem tome uma dose adicional. O que pode interagir com  este medicamento? No tome este medicamento com nenhum dos seguintes: -alguns medicamentos para infeces fngicas, como fluconazol, itraconazol, cetoconazol, posaconazol, voriconazol -cisaprida -desvenlafaxina -dofetilida -dronedarona -duloxetina -levomilnaciprana -linezolida -medicamentos chamados inibidores da MAO, como Nardil, Parnate, Marplan, Eldepryl, Carbex -azul de metileno (injetado na veia) -milnaciprano -pimozida -tioridazina -ziprasidona Este medicamento tambm pode interagir com os seguintes remdios: -anfetamina -aspirina e medicamentos similares -alguns medicamentos para depresso, ansiedade ou transtornos psicticos -alguns medicamentos para enxaqueca, como almotriptana, eletriptana, frovatriptana, naratriptana, rizatriptana, sumatriptana, zolmitriptana -alguns medicamentos para dormir -alguns medicamentos que tratam ou previnem cogulos sanguneos, tais como dalteparina, enoxaparina e varfarina -cimetidina -clozapina -diurticos -fentanila -furazolidona -indinavir -isoniazida -ltio -metoprolol -AINEs, medicamentos analgsicos e anti-inflamatrios, como ibuprofeno, naproxeno -outros medicamentos que prolongam o intervalo QT (causam um ritmo cardaco anormal) -procarbazina -rasagilina -suplementos como erva-de-so-joo, kava kava ou valeriana -tramadol -triptofano Esta lista pode no descrever todas as interaes possveis. D ao seu profissional de sade uma lista de todos os medicamentos, ervas medicinais, remdios de venda livre, ou suplementos alimentares que voc [place]. Diga tambm se voc fuma, bebe, ou [place] drogas ilcitas. Alguns destes podem interagir com o seu medicamento. Ao que devo ficar atento quando estiver [employer] medicamento? Avise seu mdico ou profissional de sade se os seus sintomas no melhorarem ou se piorarem. Consulte seu mdico ou profissional de sade para realizar um acompanhamento regular Diplomatic Services operational officer. Uma vez que pode levar  vrias semanas para sentir os efeitos deste medicamento,  importante continuar o tratamento conforme prescrito pelo seu mdico. Os pacientes e seus familiares devem ficar atentos a uma possvel recorrncia ou piora da depresso ou pensamentos suicidas. Tambm devem ficar atentos a qualquer  Spainmudana sbita ou grave nas sensaes do paciente, como ansiedade, Pecan Acrespnico, Martin's Additionsirritao, [place] Beachhostilidade, agressividade, impulsividade, forte inquietao, excitao excessiva, hiperatividade ou insnia. Se isso acontecer, principalmente no comeo do tratamento ou aps uma mudana de dose, entre em contato com seu mdico ou profissional de sade. Este medicamento pode aumentar a presso arterial. Entre em contato com o seu mdico ou profissional de sade para obter instrues sobre o monitoramento de sua presso arterial enquanto estiver tomando este medicamento. Voc pode sentir sonolncia ou tontura. No dirija, no opere mquinas e no faa nada que exija concentrao mental at U.S. Bancorpque voc descubra como o medicamento lhe afeta. No se sente nem se levante rpido demais, principalmente se for um paciente idoso. Isso diminui o risco de Mexicotontura ou desmaio. O lcool pode interferir com os efeitos deste medicamento. Evite bebidas alcolicas. Voc pode ficar com a boca seca. Mascar chiclete sem acar ou chupar balas pode ajudar. Beba bastante gua. Entre em contato com seu mdico se o problema Science writerpersistir ou piorar. Que efeitos colaterais posso sentir aps usar este medicamento? Efeitos colaterais que devem ser informados ao seu mdico ou profissional de sade o mais rpido possvel: -reaes alrgicas, como erupo na pele, coceira, urticria, ou inchao do rosto, dos lbios ou da lngua -sensao de ansiedade -dificuldade para respirar -confuso -alteraes na viso -dor no peito -confuso -humor elevado, diminuio da necessidade de sono, pensamentos descontrolados, comportamento impulsivo -dor nos olhos -batimento cardaco  acelerado ou irregular -sensao de tontura, desmaio, quedas -sensao de agitao, raiva ou irritabilidade -alucinao, perda do contato com a realidade -presso alta -perda de equilbrio ou coordenao -palpitaes -vermelhido, bolhas, descamao ou afrouxamento da pele, inclusive dentro da boca -inquietao, agitao, incapacidade de manter-se quieto -convulses (crises convulsivas) -rigidez muscular -pensamentos suicidas ou outras alteraes do humor -dificuldade para urinar ou alterao na quantidade de urina -dificuldade para dormir -sangramentos ou hematomas fora do comum -fraqueza ou cansao fora do comum -vmitos Efeitos colaterais que normalmente no precisam de cuidados mdicos (avise ao seu mdico ou profissional de sade se persistirem ou forem incmodos): -mudana na libido ou no desempenho sexual -alteraes no apetite ou no peso -priso de ventre -tontura -boca seca -dor de cabea -aumento do suor -enjoo -sensao de cansao Esta lista pode no descrever todos os efeitos colaterais possveis. Para mais orientaes sobre efeitos colaterais, consulte o seu mdico. Voc pode relatar a ocorrncia de efeitos colaterais  FDA pelo telefone [phone]. Onde devo guardar meu medicamento? Mal MistyManter fora do Guardian Life Insurancealcance das crianas. Conservar sob temperatura controlada, entre 20 e 25 degreesC (68 e 77 degreesF). Armazenar em local seco. Descartar qualquer medicamento no utilizado aps a data de validade impressa no rtulo ou embalagem. OBSERVAO: Este folheto  um resumo. Pode no cobrir todas as informaes possveis. Se tiver dvidas a respeito deste medicamento, fale com seu mdico, farmacutico ou profissional de sade.  2019 Elsevier/Gold Standard (2016-03-10 00:00:00)

## 2018-08-02 ENCOUNTER — Encounter: Payer: Self-pay | Admitting: Physician Assistant

## 2018-08-02 ENCOUNTER — Ambulatory Visit (INDEPENDENT_AMBULATORY_CARE_PROVIDER_SITE_OTHER): Payer: BLUE CROSS/BLUE SHIELD | Admitting: Physician Assistant

## 2018-08-02 DIAGNOSIS — F418 Other specified anxiety disorders: Secondary | ICD-10-CM | POA: Insufficient documentation

## 2018-08-02 NOTE — Telephone Encounter (Signed)
Returned call to patient. Spoke with patient regarding benefit for an ultrasound. Patient understood and agreeable. Patient ready to schedule. Patient scheduled 08/09/2018 with Dr Quincy Simmonds. Patient appointment date, arrival time and cancellation policy. No further questions. Ok to close

## 2018-08-02 NOTE — Progress Notes (Signed)
Virtual Visit via Video   I connected with Norma Garcia on 08/02/18 at 11:20 AM EDT by a video enabled telemedicine application and verified that I am speaking with the correct person using two identifiers. Location patient: Home Location provider: [facility], Office Persons participating in the virtual visit: Shalyn Koral Intriago, Inda Coke PA-C, Anselmo Pickler, LPN   I discussed the limitations of evaluation and management by telemedicine and the availability of in person appointments. The patient expressed understanding and agreed to proceed.  I acted as a Education administrator for [employer], [employer], LPN  Subjective:   HPI:   Anxiety Pt following up today. She was newly dx with anxiety on 6/3 last visit. Pt was started on hydrOXYzine 25 mg at bedtime. She used this twice for insomnia, she tolerated it well. She saw her ob-gyn yesterday, Dr. Gala Romney and also discussed her symptoms at that time. She was started on 37.5 mg effexor. She has not started it yet, but plans to start it tomorrow.   Denies SI/HI  ROS: See pertinent positives and negatives per HPI.  Patient Active Problem List   Diagnosis Date Noted  . Situational anxiety 08/02/2018    Social History   Tobacco Use  . Smoking status: Never Smoker  . Smokeless tobacco: Never Used  Substance Use Topics  . Alcohol use: Yes    Frequency: Never    Comment: socially    Current Outpatient Medications:  .  ascorbic acid (VITAMIN C) 250 MG tablet, Take 250 mg by mouth daily., Disp: , Rfl:  .  hydrOXYzine (ATARAX/VISTARIL) 25 MG tablet, Take 1 tablet (25 mg total) by mouth at bedtime as needed for anxiety (insomnia). Take one 25 mg tablet 30-60 minutes prior to bedtime for insomnia, anxiety. May increase to two tablets., Disp: 30 tablet, Rfl: 0 .  ibuprofen (ADVIL,MOTRIN) 200 MG tablet, Take 400 mg as needed by mouth., Disp: , Rfl:  .  Multiple Vitamin (MULTIVITAMIN) tablet, Take 1 tablet by mouth  daily., Disp: , Rfl:  .  PARAGARD INTRAUTERINE COPPER IU, by Intrauterine route. IUD was placed 07/2015 by GYN, needs to be removed in 07/2025., Disp: , Rfl:  .  venlafaxine XR (EFFEXOR XR) 37.5 MG 24 hr capsule, Take 1 capsule (37.5 mg total) by mouth daily., Disp: 30 capsule, Rfl: 1  Allergies  Allergen Reactions  . Reglan [Metoclopramide] Anxiety    Objective:   VITALS: Per patient if applicable, see vitals. GENERAL: Alert, appears well and in no acute distress. HEENT: Atraumatic, conjunctiva clear, no obvious abnormalities on inspection of external nose and ears. NECK: Normal movements of the head and neck. CARDIOPULMONARY: No increased WOB. Speaking in clear sentences. I:E ratio WNL.  MS: Moves all visible extremities without noticeable abnormality. PSYCH: Pleasant and cooperative, well-groomed. Speech normal rate and rhythm. Affect is appropriate. Insight and judgement are appropriate. Attention is focused, linear, and appropriate.  NEURO: CN grossly intact. Oriented as arrived to appointment on time with no prompting. Moves both UE equally.  SKIN: No obvious lesions, wounds, erythema, or cyanosis noted on face or hands.  Assessment and Plan:   Diagnoses and all orders for this visit:  Situational anxiety  Starting effexor under direction of her ob-gyn. Discussed safe use of medication. May use atarax prn while on this medication. Encouraged close follow-up with me or ob-gyn if any concerns. I discussed with patient that if they develop any SI, to tell someone immediately and seek medical attention.  . Reviewed  expectations re: course of current medical issues. . Discussed self-management of symptoms. . Outlined signs and symptoms indicating need for more acute intervention. . Patient verbalized understanding and all questions were answered. Marland Kitchen. Health Maintenance issues including appropriate healthy diet, exercise, and smoking avoidance were discussed with patient. . See orders  for this visit as documented in the electronic medical record.  I discussed the assessment and treatment plan with the patient. The patient was provided an opportunity to ask questions and all were answered. The patient agreed with the plan and demonstrated an understanding of the instructions.   The patient was advised to call back or seek an in-person evaluation if the symptoms worsen or if the condition fails to improve as anticipated.   CMA or LPN served as scribe during this visit. History, Physical, and Plan performed by medical provider. The above documentation has been reviewed and is accurate and complete.   KamailiSamantha Kem Parcher, [place] 08/02/2018

## 2018-08-02 NOTE — Telephone Encounter (Signed)
Patient returned call

## 2018-08-07 ENCOUNTER — Telehealth: Payer: Self-pay | Admitting: Obstetrics and Gynecology

## 2018-08-07 NOTE — Telephone Encounter (Signed)
Patient cancelled and would like to reschedule her ultrasound appointment.

## 2018-08-07 NOTE — Telephone Encounter (Signed)
Left message to call Sharee Pimple, RN at [facility].   PUS with Dr. Quincy Simmonds for dysmenorrhea and IUD check

## 2018-08-09 ENCOUNTER — Other Ambulatory Visit: Payer: BC Managed Care – PPO | Admitting: Obstetrics and Gynecology

## 2018-08-09 ENCOUNTER — Other Ambulatory Visit: Payer: BC Managed Care – PPO

## 2018-08-09 NOTE — Telephone Encounter (Signed)
Left message to call Lasundra Hascall, RN at [facility] [phone].   

## 2018-08-13 NOTE — Telephone Encounter (Signed)
Left detailed message, ok per dpr. Request to return call to [place], [place] at [facility] [phone] in regard to rescheduling PUS.   MyChart message to patient.

## 2018-08-13 NOTE — Telephone Encounter (Signed)
Spoke with patient. PUS rescheduled to7/9/20 at 11am, consult to follow at 11:30am with Dr. Quincy Simmonds. Patient declines earlier appt, will be out of town.   Routing to provider for final review. Patient is agreeable to disposition. Will close encounter.  Cc: Lerry Liner, Magdalene Patricia

## 2018-08-13 NOTE — Telephone Encounter (Signed)
Patient returning call to schedule ultrasound.

## 2018-08-28 ENCOUNTER — Telehealth: Payer: Self-pay | Admitting: Obstetrics and Gynecology

## 2018-08-28 NOTE — Telephone Encounter (Signed)
Patient cancelled ultrasound for Thursday because of childcare. Would like a call back to reschedule.

## 2018-08-28 NOTE — Telephone Encounter (Signed)
Call to patient. Patient's PUS appointment rescheduled to Thursday 09-06-2018 at 0800 with consult with Dr. Quincy Simmonds at [facility]. Patient agreeable to date and time of appointments.   Routing to provider and will close encounter.

## 2018-08-30 ENCOUNTER — Other Ambulatory Visit: Payer: Self-pay | Admitting: Obstetrics and Gynecology

## 2018-08-30 ENCOUNTER — Other Ambulatory Visit: Payer: BC Managed Care – PPO

## 2018-09-04 ENCOUNTER — Other Ambulatory Visit: Payer: Self-pay

## 2018-09-06 ENCOUNTER — Ambulatory Visit: Payer: BC Managed Care – PPO | Admitting: Obstetrics and Gynecology

## 2018-09-06 ENCOUNTER — Ambulatory Visit (INDEPENDENT_AMBULATORY_CARE_PROVIDER_SITE_OTHER): Payer: BC Managed Care – PPO

## 2018-09-06 ENCOUNTER — Other Ambulatory Visit: Payer: Self-pay

## 2018-09-06 ENCOUNTER — Encounter: Payer: Self-pay | Admitting: Obstetrics and Gynecology

## 2018-09-06 VITALS — BP 108/60 | HR 68 | Temp 97.2°F | Resp 12 | Ht 59.0 in | Wt 118.6 lb

## 2018-09-06 DIAGNOSIS — N946 Dysmenorrhea, unspecified: Secondary | ICD-10-CM

## 2018-09-06 DIAGNOSIS — F419 Anxiety disorder, unspecified: Secondary | ICD-10-CM | POA: Diagnosis not present

## 2018-09-06 DIAGNOSIS — Z30431 Encounter for routine checking of intrauterine contraceptive device: Secondary | ICD-10-CM | POA: Diagnosis not present

## 2018-09-06 DIAGNOSIS — N940 Mittelschmerz: Secondary | ICD-10-CM | POA: Diagnosis not present

## 2018-09-06 MED ORDER — VENLAFAXINE HCL ER 37.5 MG PO CP24
37.5000 mg | ORAL_CAPSULE | Freq: Every day | ORAL | 10 refills | Status: DC
Start: 2018-09-06 — End: 2019-07-29

## 2018-09-06 NOTE — Progress Notes (Signed)
Encounter reviewed by Dr. Brook Amundson C. Silva.  

## 2018-09-06 NOTE — Progress Notes (Signed)
Addendum; Small cystic space, 5 x 4 mm, seen at periphery of em, cannot r/o cyst within em vs myometrial cystic space.

## 2018-09-06 NOTE — Progress Notes (Signed)
GYNECOLOGY  VISIT   HPI: 28 y.o.   Married  [place]  female   [phone] with Patient's last menstrual period was 08/23/2018.   here for ultrasound for dysmenorrhea and ovulation pain.  Has ParaGard IUD currently.   Used Mirena in the past and it was removed due to pain.   Her husband is planning vasectomy.   Hx migraine with aura.   Has anxiety with panic and was started on Effexor 37.5 mg after visit on 08/01/18.  She states she is doing well, starting 15 days after starting the medication.  Not so anxious with the pandemic. Now her thoughts are more positive.  No more panic.  Interested in exercise and taking good care of herself with nutrition.   GYNECOLOGIC HISTORY: Patient's last menstrual period was 08/23/2018. Contraception:  Paragard IUD Menopausal hormone therapy:  n/a Last mammogram:  n/a Last pap smear:   08/15/17 Negative        OB History    Gravida  2   Para  2   Term  2   Preterm  0   AB  0   Living  2     SAB  0   TAB  0   Ectopic  0   Multiple      Live Births  2              Patient Active Problem List   Diagnosis Date Noted  . Situational anxiety 08/02/2018    Past Medical History:  Diagnosis Date  . Anxiety   . 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 Headache    occasional migraines during second trimester; none during third trimester  . History of chicken pox   . Hx of urinary tract infection   . Keloid   . Postpartum care following cesarean delivery (4/28) 06/19/2013  . Postpartum care following VBAC delivery (4/27) 06/18/2015    Past Surgical History:  Procedure Laterality Date  . CESAREAN SECTION N/A 06/18/2013   Procedure: CESAREAN SECTION;  Surgeon: Genia DelMarie-Lyne Lavoie, MD;  Location: [facility];  Service: Obstetrics;  Laterality: N/A;  . CESAREAN SECTION  2015    Current Outpatient Medications  Medication Sig Dispense Refill  . ascorbic acid (VITAMIN C) 250 MG tablet Take 250 mg by mouth daily.    . hydrOXYzine  (ATARAX/VISTARIL) 25 MG tablet Take 1 tablet (25 mg total) by mouth at bedtime as needed for anxiety (insomnia). Take one 25 mg tablet 30-60 minutes prior to bedtime for insomnia, anxiety. May increase to two tablets. 30 tablet 0  . ibuprofen (ADVIL,MOTRIN) 200 MG tablet Take 400 mg as needed by mouth.    . Multiple Vitamin (MULTIVITAMIN) tablet Take 1 tablet by mouth daily.    Marland Kitchen. PARAGARD INTRAUTERINE COPPER IU by Intrauterine route. IUD was placed 07/2015 by GYN, needs to be removed in 07/2025.    Marland Kitchen. venlafaxine XR (EFFEXOR XR) 37.5 MG 24 hr capsule Take 1 capsule (37.5 mg total) by mouth daily. 30 capsule 1   No current facility-administered medications for this visit.      ALLERGIES: Reglan [metoclopramide]  Family History  Problem Relation Age of Onset  . Hypertension Father   . Hyperlipidemia Father   . Thyroid nodules Sister   . Diabetes Paternal Grandmother   . Breast cancer Neg Hx   . Colon cancer Neg Hx     Social History   Socioeconomic History  . Marital status: Married    Spouse name: Not on file  . Number  of children: Not on file  . Years of education: Not on file  . Highest education level: Not on file  Occupational History  . Not on file  Social Needs  . Financial resource strain: Not on file  . Food insecurity    Worry: Not on file    Inability: Not on file  . Transportation needs    Medical: Not on file    Non-medical: Not on file  Tobacco Use  . Smoking status: Never Smoker  . Smokeless tobacco: Never Used  Substance and Sexual Activity  . Alcohol use: Yes    Frequency: Never    Comment: socially  . Drug use: No  . Sexual activity: Yes    Partners: Male    Birth control/protection: I.U.D.    Comment: Paragard June 2017  Lifestyle  . Physical activity    Days per week: Not on file    Minutes per session: Not on file  . Stress: Not on file  Relationships  . Social Herbalist on phone: Not on file    Gets together: Not on file     Attends religious service: Not on file    Active member of club or organization: Not on file    Attends meetings of clubs or organizations: Not on file    Relationship status: Not on file  . Intimate partner violence    Fear of current or ex partner: Not on file    Emotionally abused: Not on file    Physically abused: Not on file    Forced sexual activity: Not on file  Other Topics Concern  . Not on file  Social History Narrative   ** Merged History Encounter **       From [place], moved here 4 years ago 2 children Married Husband works, she is a Surveyor, minerals    Review of Systems  Constitutional: Negative.   HENT: Negative.   Eyes: Negative.   Respiratory: Negative.   Cardiovascular: Negative.   Gastrointestinal: Negative.   Endocrine: Negative.   Genitourinary: Negative.   Musculoskeletal: Negative.   Skin: Negative.   Allergic/Immunologic: Negative.   Neurological: Negative.   Hematological: Negative.   Psychiatric/Behavioral: Negative.     PHYSICAL EXAMINATION:    BP 108/60 (BP Location: Right Arm, Patient Position: Sitting, Cuff Size: Normal)   Pulse 68   Temp (!) 97.2 F (36.2 C) (Temporal)   Resp 12   Ht 4\' 11"  (1.499 m)   Wt 118 lb 9.6 oz (53.8 kg)   LMP 08/23/2018   BMI 23.95 kg/m     General appearance: alert, cooperative and appears stated age   Pelvic US Uterus no masses.  Small cyst in endometrium or adjacent to it.  IUD in proper position.  Ovaries normal. No free fluid.   ASSESSMENT  Anxiety. Dysmenorrhea. Ovulation pain.  PLAN  Continue Effexor 37.5 mg daily.  She will do a pain calendar.  Use NSAIDs.  She declines Micronor, Depo Provera, laparoscopy, IUD removal at this time. Return for annual exam later this month.    An After Visit Summary was printed and given to the patient.  __15___ minutes face to face time of which over 50% was spent in counseling.

## 2018-09-10 ENCOUNTER — Other Ambulatory Visit: Payer: Self-pay

## 2018-09-12 ENCOUNTER — Encounter: Payer: Self-pay | Admitting: Obstetrics and Gynecology

## 2018-09-12 ENCOUNTER — Ambulatory Visit: Payer: BC Managed Care – PPO | Admitting: Obstetrics and Gynecology

## 2018-09-12 ENCOUNTER — Other Ambulatory Visit: Payer: Self-pay

## 2018-09-12 VITALS — BP 110/70 | HR 84 | Temp 97.4°F | Resp 12 | Ht 59.5 in | Wt 115.0 lb

## 2018-09-12 DIAGNOSIS — Z01419 Encounter for gynecological examination (general) (routine) without abnormal findings: Secondary | ICD-10-CM

## 2018-09-12 NOTE — Progress Notes (Signed)
28 y.o. [id number] Married [place] female here for annual exam.    On Effexor 37.5 mg daily for anxiety.   Thinks she is ovulating.  Doing regular exercise and this is helping her pain.  Working on good nutrition also.   US done 09/06/18 - IUD in normal position.  No vaginal bleeding since her LMP on 08/23/18.  PCP: Inda Coke, PA    Patient's last menstrual period was 08/23/2018.           Sexually active: Yes.    The current method of family planning is Paragard IUD.    Exercising: Yes.    workouts and running Smoker:  no  Health Maintenance: Pap:  08/15/17 Negative History of abnormal Pap:  no MMG:  01/10/18 Bilateral Breast US -- BIRADS 1 negative Tdap: 08/31/16 Gardasil:   no HIV: 01/04/17 Negative Hep C: never Screening Labs: discuss if needed.  Completed in November 2019.    reports that she has never smoked. She has never used smokeless tobacco. She reports current alcohol use. She reports that she does not use drugs.  Past Medical History:  Diagnosis Date  . Anxiety   . 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 Headache    occasional migraines during second trimester; none during third trimester  . History of chicken pox   . Hx of urinary tract infection   . Keloid   . Postpartum care following cesarean delivery (4/28) 06/19/2013  . Postpartum care following VBAC delivery (4/27) 06/18/2015    Past Surgical History:  Procedure Laterality Date  . CESAREAN SECTION N/A 06/18/2013   Procedure: CESAREAN SECTION;  Surgeon: Princess Bruins, MD;  Location: [facility];  Service: Obstetrics;  Laterality: N/A;  . CESAREAN SECTION  2015    Current Outpatient Medications  Medication Sig Dispense Refill  . ascorbic acid (VITAMIN C) 250 MG tablet Take 250 mg by mouth daily.    . hydrOXYzine (ATARAX/VISTARIL) 25 MG tablet Take 1 tablet (25 mg total) by mouth at bedtime as needed for anxiety (insomnia). Take one 25 mg tablet 30-60 minutes prior to bedtime for insomnia,  anxiety. May increase to two tablets. 30 tablet 0  . ibuprofen (ADVIL,MOTRIN) 200 MG tablet Take 400 mg as needed by mouth.    . Multiple Vitamin (MULTIVITAMIN) tablet Take 1 tablet by mouth daily.    Marland Kitchen PARAGARD INTRAUTERINE COPPER IU by Intrauterine route. IUD was placed 07/2015 by GYN, needs to be removed in 07/2025.    Marland Kitchen venlafaxine XR (EFFEXOR XR) 37.5 MG 24 hr capsule Take 1 capsule (37.5 mg total) by mouth daily. 30 capsule 10   No current facility-administered medications for this visit.     Family History  Problem Relation Age of Onset  . Hypertension Father   . Hyperlipidemia Father   . Thyroid nodules Sister   . Diabetes Paternal Grandmother   . Breast cancer Neg Hx   . Colon cancer Neg Hx     Review of Systems  Constitutional: Negative.   HENT: Negative.   Eyes: Negative.   Respiratory: Negative.   Cardiovascular: Negative.   Gastrointestinal: Negative.   Endocrine: Negative.   Genitourinary: Negative.   Musculoskeletal: Negative.   Skin: Negative.   Allergic/Immunologic: Negative.   Neurological: Negative.   Hematological: Negative.   Psychiatric/Behavioral: Negative.     Exam:   BP 110/70 (BP Location: Right Arm, Patient Position: Sitting, Cuff Size: Normal)   Pulse 84   Temp (!) 97.4 F (36.3 C) (Temporal)   Resp 12  Ht 4' 11.5" (1.511 m)   Wt 115 lb (52.2 kg)   LMP 08/23/2018   BMI 22.84 kg/m     General appearance: alert, cooperative and appears stated age Head: normocephalic, without obvious abnormality, atraumatic Neck: no adenopathy, supple, symmetrical, trachea midline and thyroid normal to inspection and palpation Lungs: clear to auscultation bilaterally Breasts: normal appearance, no masses or tenderness, No nipple retraction or dimpling, No nipple discharge or bleeding, No axillary adenopathy Heart: regular rate and rhythm Abdomen: soft, non-tender; no masses, no organomegaly Extremities: extremities normal, atraumatic, no cyanosis or  edema Skin: skin color, texture, turgor normal. No rashes or lesions Lymph nodes: cervical, supraclavicular, and axillary nodes normal. Neurologic: grossly normal  Pelvic: External genitalia:  no lesions              No abnormal inguinal nodes palpated.              Urethra:  normal appearing urethra with no masses, tenderness or lesions              Bartholins and Skenes: normal                 Vagina: normal appearing vagina with normal color and discharge, no lesions              Cervix: no lesions.  IUD strings not seen.              Pap taken: No. Bimanual Exam:  Uterus:  normal size, contour, position, consistency, mobility, non-tender              Adnexa: no mass, fullness, tenderness             Chaperone was present for exam.  Assessment:   Well woman visit with normal exam. Hx migraine with aura.  Anxiety.  On Effexor XR 37.5 mg daily. Paragard IUD.  Strings not visible.  IUD seen in normal position on 09/06/18.  I suspect the strings may be inside the cervical canal. Dysmenorrhea and ovulatory pain.  Normal pelvic US 09/06/18.   Plan: Mammogram screening discussed. Self breast awareness reviewed. Pap and HR HPV as above. Guidelines for Calcium, Vitamin D, regular exercise program including cardiovascular and weight bearing exercise. FU in 3 months for a recheck in 3 months to check IUD and see how her pelvic pain is doing. Continue Effexor XR.  Follow up annually and prn.   After visit summary provided.

## 2018-09-12 NOTE — Patient Instructions (Signed)

## 2018-09-14 ENCOUNTER — Encounter: Payer: Self-pay | Admitting: Obstetrics and Gynecology

## 2018-09-14 ENCOUNTER — Telehealth: Payer: Self-pay | Admitting: Obstetrics and Gynecology

## 2018-09-14 NOTE — Telephone Encounter (Signed)
Hi Norma Garcia,     I hate to say that, but I am anxious about my IUD string.    I know we already did the ultrasound, but since the string is our way to check if it is on place, I am kind of worried.     I can't think about the possibility of any pregnancy (I just loose my breath when I think).    Do you think is anything else we can do? It's anyway to put this strung back in place?    Thank you! And I am sorry about that, but I really don't want to feel the anxious again.    Norma Garcia

## 2018-09-14 NOTE — Telephone Encounter (Signed)
Please have her come in at 12:45 on 09/18/18.  I will re-examine her.  If unable to see the IUD strings, we will do another ultrasound.   Cc- Lamont Snowball

## 2018-09-14 NOTE — Telephone Encounter (Signed)
Routing to Dr. Silva to review and advise.  

## 2018-09-14 NOTE — Telephone Encounter (Signed)
Reviewed with Dr Quincy Simmonds. Due to potential for ultrasound, will adjust office visit to 12, noon.  Call to patient. Left message to call back to triage nurse.

## 2018-09-17 NOTE — Telephone Encounter (Signed)
Patient canceled her IUD follow up appointment 09/18/18. She said she started her cycle and will call later to reschedule.

## 2018-09-18 ENCOUNTER — Ambulatory Visit: Payer: BC Managed Care – PPO | Admitting: Obstetrics and Gynecology

## 2018-09-19 NOTE — Telephone Encounter (Signed)
Spoke with patient. Patient states she is on her menses and request to schedule OV for next week after menses is completed. Advised I will review schedule with nursing supervisor and return call. Patient agreeable.

## 2018-09-19 NOTE — Telephone Encounter (Signed)
Please contact patient to finalize a plan for her recheck appointment. She may need an ultrasound to check for her IUD.  She recently had an ultrasound, and the IUD was in the proper position.  She then returned some days later and had a pelvic exam, and the strings were not seen.

## 2018-09-19 NOTE — Telephone Encounter (Signed)
Call reviewed with Norma Blitz, RN. Call returned to patient. OV scheduled for 8/4 at 12pm with Dr. Quincy Simmonds, PUS to follow at 12:30pm, if needed. Patient verbalizes understanding and is agreeable.   Routing to provider for final review. Patient is agreeable to disposition. Will close encounter.

## 2018-09-21 ENCOUNTER — Other Ambulatory Visit: Payer: Self-pay

## 2018-09-21 NOTE — Progress Notes (Deleted)
GYNECOLOGY  VISIT   HPI: 28 y.o.   Married  [place]  female   [phone] with No LMP recorded.   here for     GYNECOLOGIC HISTORY: No LMP recorded. Contraception: Paragard IUD Menopausal hormone therapy:  none Last mammogram:01/10/18 Bilateral Breast US -- BIRADS 1 negative  Last pap smear: 08/15/17 Negative        OB History    Gravida  2   Para  2   Term  2   Preterm  0   AB  0   Living  2     SAB  0   TAB  0   Ectopic  0   Multiple      Live Births  2              Patient Active Problem List   Diagnosis Date Noted  . Situational anxiety 08/02/2018    Past Medical History:  Diagnosis Date  . Anxiety   . 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 Headache    occasional migraines during second trimester; none during third trimester  . History of chicken pox   . Hx of urinary tract infection   . Keloid   . Postpartum care following cesarean delivery (4/28) 06/19/2013  . Postpartum care following VBAC delivery (4/27) 06/18/2015    Past Surgical History:  Procedure Laterality Date  . CESAREAN SECTION N/A 06/18/2013   Procedure: CESAREAN SECTION;  Surgeon: Princess Bruins, MD;  Location: [facility];  Service: Obstetrics;  Laterality: N/A;  . CESAREAN SECTION  2015    Current Outpatient Medications  Medication Sig Dispense Refill  . ascorbic acid (VITAMIN C) 250 MG tablet Take 250 mg by mouth daily.    . hydrOXYzine (ATARAX/VISTARIL) 25 MG tablet Take 1 tablet (25 mg total) by mouth at bedtime as needed for anxiety (insomnia). Take one 25 mg tablet 30-60 minutes prior to bedtime for insomnia, anxiety. May increase to two tablets. 30 tablet 0  . ibuprofen (ADVIL,MOTRIN) 200 MG tablet Take 400 mg as needed by mouth.    . Multiple Vitamin (MULTIVITAMIN) tablet Take 1 tablet by mouth daily.    Marland Kitchen PARAGARD INTRAUTERINE COPPER IU by Intrauterine route. IUD was placed 07/2015 by GYN, needs to be removed in 07/2025.    Marland Kitchen venlafaxine XR (EFFEXOR XR) 37.5 MG 24 hr  capsule Take 1 capsule (37.5 mg total) by mouth daily. 30 capsule 10   No current facility-administered medications for this visit.      ALLERGIES: Reglan [metoclopramide]  Family History  Problem Relation Age of Onset  . Hypertension Father   . Hyperlipidemia Father   . Thyroid nodules Sister   . Diabetes Paternal Grandmother   . Breast cancer Neg Hx   . Colon cancer Neg Hx     Social History   Socioeconomic History  . Marital status: Married    Spouse name: Not on file  . Number of children: Not on file  . Years of education: Not on file  . Highest education level: Not on file  Occupational History  . Not on file  Social Needs  . Financial resource strain: Not on file  . Food insecurity    Worry: Not on file    Inability: Not on file  . Transportation needs    Medical: Not on file    Non-medical: Not on file  Tobacco Use  . Smoking status: Never Smoker  . Smokeless tobacco: Never Used  Substance and Sexual Activity  . Alcohol  use: Yes    Frequency: Never    Comment: socially  . Drug use: No  . Sexual activity: Yes    Partners: Male    Birth control/protection: I.U.D.    Comment: Paragard June 2017  Lifestyle  . Physical activity    Days per week: Not on file    Minutes per session: Not on file  . Stress: Not on file  Relationships  . Social Musicianconnections    Talks on phone: Not on file    Gets together: Not on file    Attends religious service: Not on file    Active member of club or organization: Not on file    Attends meetings of clubs or organizations: Not on file    Relationship status: Not on file  . Intimate partner violence    Fear of current or ex partner: Not on file    Emotionally abused: Not on file    Physically abused: Not on file    Forced sexual activity: Not on file  Other Topics Concern  . Not on file  Social History Narrative   ** Merged History Encounter **       From [place], moved here 4 years ago 2 children Married Husband  works, she is a Location managerTAHM    Review of Systems  PHYSICAL EXAMINATION:    There were no vitals taken for this visit.    General appearance: alert, cooperative and appears stated age Head: Normocephalic, without obvious abnormality, atraumatic Neck: no adenopathy, supple, symmetrical, trachea midline and thyroid normal to inspection and palpation Lungs: clear to auscultation bilaterally Breasts: normal appearance, no masses or tenderness, No nipple retraction or dimpling, No nipple discharge or bleeding, No axillary or supraclavicular adenopathy Heart: regular rate and rhythm Abdomen: soft, non-tender, no masses,  no organomegaly Extremities: extremities normal, atraumatic, no cyanosis or edema Skin: Skin color, texture, turgor normal. No rashes or lesions Lymph nodes: Cervical, supraclavicular, and axillary nodes normal. No abnormal inguinal nodes palpated Neurologic: Grossly normal  Pelvic: External genitalia:  no lesions              Urethra:  normal appearing urethra with no masses, tenderness or lesions              Bartholins and Skenes: normal                 Vagina: normal appearing vagina with normal color and discharge, no lesions              Cervix: no lesions                Bimanual Exam:  Uterus:  normal size, contour, position, consistency, mobility, non-tender              Adnexa: no mass, fullness, tenderness              Rectal exam: {yes no:314532}.  Confirms.              Anus:  normal sphincter tone, no lesions  Chaperone was present for exam.  ASSESSMENT     PLAN     An After Visit Summary was printed and given to the patient.  ______ minutes face to face time of which over 50% was spent in counseling.

## 2018-09-24 ENCOUNTER — Telehealth: Payer: Self-pay | Admitting: Obstetrics and Gynecology

## 2018-09-24 NOTE — Telephone Encounter (Signed)
Call reviewed with Kandace Blitz, RN.   Spoke with patient. Patient states she is experiencing some vaginal discomfort after swimming in the pool this weekend, does not feel comfortable with pelvic exam at this time. OV scheduled for 8/10 at 9:30am with Dr. Quincy Simmonds. Patient is aware she may need to return for PUS at later date. Patient verbalizes understanding and is agreeable.   Routing to provider for final review. Patient is agreeable to disposition. Will close encounter.

## 2018-09-24 NOTE — Telephone Encounter (Signed)
Patient canceled her  IUD check, possible PUS appointment tomorrow. She would like to reschedule to next Tuesday. To triage to assist with scheduling.

## 2018-09-25 ENCOUNTER — Ambulatory Visit: Payer: BC Managed Care – PPO | Admitting: Obstetrics and Gynecology

## 2018-09-27 ENCOUNTER — Other Ambulatory Visit: Payer: Self-pay

## 2018-09-27 NOTE — Progress Notes (Signed)
GYNECOLOGY  VISIT   HPI: 28 y.o.   Married  [place]  female   [phone] with Patient's last menstrual period was 09/19/2018 (exact date).   here for follow up on pelvic pain and check IUD.  Had some cramping during her recent cycle, one day more strong.  The other days more tolerable.  Increased flow with menstruation this cycle.  Currently still content with her ParaGard IUD.  I could not see the strings with her recent visit, but she just had a pelvic US done prior to the visit and the IUD was in a normal position.  Thinks she had a yeast infection last week.  Had itching after using Tampax.  Used Monistat 3 and symptoms resolved.   GYNECOLOGIC HISTORY: Patient's last menstrual period was 09/19/2018 (exact date). Contraception: Paragard IUD 07/2015 Menopausal hormone therapy:  n/a Last mammogram:  n/a Last pap smear: 08-15-17 Neg        OB History    Gravida  2   Para  2   Term  2   Preterm  0   AB  0   Living  2     SAB  0   TAB  0   Ectopic  0   Multiple      Live Births  2              Patient Active Problem List   Diagnosis Date Noted  . Situational anxiety 08/02/2018    Past Medical History:  Diagnosis Date  . Anxiety   . 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 Headache    occasional migraines during second trimester; none during third trimester  . History of chicken pox   . Hx of urinary tract infection   . Keloid   . Postpartum care following cesarean delivery (4/28) 06/19/2013  . Postpartum care following VBAC delivery (4/27) 06/18/2015    Past Surgical History:  Procedure Laterality Date  . CESAREAN SECTION N/A 06/18/2013   Procedure: CESAREAN SECTION;  Surgeon: Princess Bruins, MD;  Location: [facility];  Service: Obstetrics;  Laterality: N/A;  . CESAREAN SECTION  2015    Current Outpatient Medications  Medication Sig Dispense Refill  . ascorbic acid (VITAMIN C) 250 MG tablet Take 250 mg by mouth daily.    . hydrOXYzine  (ATARAX/VISTARIL) 25 MG tablet Take 1 tablet (25 mg total) by mouth at bedtime as needed for anxiety (insomnia). Take one 25 mg tablet 30-60 minutes prior to bedtime for insomnia, anxiety. May increase to two tablets. 30 tablet 0  . ibuprofen (ADVIL,MOTRIN) 200 MG tablet Take 400 mg as needed by mouth.    . Multiple Vitamin (MULTIVITAMIN) tablet Take 1 tablet by mouth daily.    Marland Kitchen PARAGARD INTRAUTERINE COPPER IU by Intrauterine route. IUD was placed 07/2015 by GYN, needs to be removed in 07/2025.    Marland Kitchen venlafaxine XR (EFFEXOR XR) 37.5 MG 24 hr capsule Take 1 capsule (37.5 mg total) by mouth daily. 30 capsule 10   No current facility-administered medications for this visit.      ALLERGIES: Reglan [metoclopramide]  Family History  Problem Relation Age of Onset  . Hypertension Father   . Hyperlipidemia Father   . Thyroid nodules Sister   . Diabetes Paternal Grandmother   . Breast cancer Neg Hx   . Colon cancer Neg Hx     Social History   Socioeconomic History  . Marital status: Married    Spouse name: Not on file  . Number of children:  Not on file  . Years of education: Not on file  . Highest education level: Not on file  Occupational History  . Not on file  Social Needs  . Financial resource strain: Not on file  . Food insecurity    Worry: Not on file    Inability: Not on file  . Transportation needs    Medical: Not on file    Non-medical: Not on file  Tobacco Use  . Smoking status: Never Smoker  . Smokeless tobacco: Never Used  Substance and Sexual Activity  . Alcohol use: Yes    Frequency: Never    Comment: socially  . Drug use: No  . Sexual activity: Yes    Partners: Male    Birth control/protection: I.U.D.    Comment: Paragard June 2017  Lifestyle  . Physical activity    Days per week: Not on file    Minutes per session: Not on file  . Stress: Not on file  Relationships  . Social Musicianconnections    Talks on phone: Not on file    Gets together: Not on file     Attends religious service: Not on file    Active member of club or organization: Not on file    Attends meetings of clubs or organizations: Not on file    Relationship status: Not on file  . Intimate partner violence    Fear of current or ex partner: Not on file    Emotionally abused: Not on file    Physically abused: Not on file    Forced sexual activity: Not on file  Other Topics Concern  . Not on file  Social History Narrative   ** Merged History Encounter **       From [place], moved here 4 years ago 2 children Married Husband works, she is a Location managerTAHM    Review of Systems  All other systems reviewed and are negative.   PHYSICAL EXAMINATION:    BP 108/70   Pulse 66   Temp (!) 97.4 F (36.3 C) (Temporal)   Ht 4' 11.5" (1.511 m)   Wt 115 lb 9.6 oz (52.4 kg)   LMP 09/19/2018 (Exact Date)   BMI 22.96 kg/m     General appearance: alert, cooperative and appears stated age   Pelvic: External genitalia:  no lesions              Urethra:  normal appearing urethra with no masses, tenderness or lesions              Bartholins and Skenes: normal                 Vagina: normal appearing vagina with normal color and discharge, no lesions              Cervix: no lesions.  IUD strings noted.                 Bimanual Exam:  Uterus:  normal size, contour, position, consistency, mobility, non-tender              Adnexa: no mass, fullness, tenderness           Chaperone was present for exam.  ASSESSMENT  IUD check up. Paragard IUD patient.   PLAN  Reassurance regarding IUD position and prevention for pregnancy.  Fu in October.  Consider Micronor if desires additional treatment of pain.    An After Visit Summary was printed and given to the patient.  _15____ minutes face  to face time of which over 50% was spent in counseling.

## 2018-09-27 NOTE — Progress Notes (Deleted)
GYNECOLOGY  VISIT   HPI: 28 y.o.   Married  [place]  female   [phone] with No LMP recorded.   here for follow up.   GYNECOLOGIC HISTORY: No LMP recorded. Contraception: Paragard IUD Menopausal hormone therapy:  n/a Last mammogram: 01/10/18 Bilateral Breast US -- BIRADS 1 negative Last pap smear: 08/15/17 Negative        OB History    Gravida  2   Para  2   Term  2   Preterm  0   AB  0   Living  2     SAB  0   TAB  0   Ectopic  0   Multiple      Live Births  2              Patient Active Problem List   Diagnosis Date Noted  . Situational anxiety 08/02/2018    Past Medical History:  Diagnosis Date  . Anxiety   . 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 Headache    occasional migraines during second trimester; none during third trimester  . History of chicken pox   . Hx of urinary tract infection   . Keloid   . Postpartum care following cesarean delivery (4/28) 06/19/2013  . Postpartum care following VBAC delivery (4/27) 06/18/2015    Past Surgical History:  Procedure Laterality Date  . CESAREAN SECTION N/A 06/18/2013   Procedure: CESAREAN SECTION;  Surgeon: Genia DelMarie-Lyne Lavoie, MD;  Location: [facility];  Service: Obstetrics;  Laterality: N/A;  . CESAREAN SECTION  2015    Current Outpatient Medications  Medication Sig Dispense Refill  . ascorbic acid (VITAMIN C) 250 MG tablet Take 250 mg by mouth daily.    . hydrOXYzine (ATARAX/VISTARIL) 25 MG tablet Take 1 tablet (25 mg total) by mouth at bedtime as needed for anxiety (insomnia). Take one 25 mg tablet 30-60 minutes prior to bedtime for insomnia, anxiety. May increase to two tablets. 30 tablet 0  . ibuprofen (ADVIL,MOTRIN) 200 MG tablet Take 400 mg as needed by mouth.    . Multiple Vitamin (MULTIVITAMIN) tablet Take 1 tablet by mouth daily.    Marland Kitchen. PARAGARD INTRAUTERINE COPPER IU by Intrauterine route. IUD was placed 07/2015 by GYN, needs to be removed in 07/2025.    Marland Kitchen. venlafaxine XR (EFFEXOR XR) 37.5 MG 24  hr capsule Take 1 capsule (37.5 mg total) by mouth daily. 30 capsule 10   No current facility-administered medications for this visit.      ALLERGIES: Reglan [metoclopramide]  Family History  Problem Relation Age of Onset  . Hypertension Father   . Hyperlipidemia Father   . Thyroid nodules Sister   . Diabetes Paternal Grandmother   . Breast cancer Neg Hx   . Colon cancer Neg Hx     Social History   Socioeconomic History  . Marital status: Married    Spouse name: Not on file  . Number of children: Not on file  . Years of education: Not on file  . Highest education level: Not on file  Occupational History  . Not on file  Social Needs  . Financial resource strain: Not on file  . Food insecurity    Worry: Not on file    Inability: Not on file  . Transportation needs    Medical: Not on file    Non-medical: Not on file  Tobacco Use  . Smoking status: Never Smoker  . Smokeless tobacco: Never Used  Substance and Sexual Activity  . Alcohol  use: Yes    Frequency: Never    Comment: socially  . Drug use: No  . Sexual activity: Yes    Partners: Male    Birth control/protection: I.U.D.    Comment: Paragard June 2017  Lifestyle  . Physical activity    Days per week: Not on file    Minutes per session: Not on file  . Stress: Not on file  Relationships  . Social Herbalist on phone: Not on file    Gets together: Not on file    Attends religious service: Not on file    Active member of club or organization: Not on file    Attends meetings of clubs or organizations: Not on file    Relationship status: Not on file  . Intimate partner violence    Fear of current or ex partner: Not on file    Emotionally abused: Not on file    Physically abused: Not on file    Forced sexual activity: Not on file  Other Topics Concern  . Not on file  Social History Narrative   ** Merged History Encounter **       From [place], moved here 4 years ago 2  children Married Husband works, she is a Surveyor, minerals    Review of Systems  PHYSICAL EXAMINATION:    There were no vitals taken for this visit.    General appearance: alert, cooperative and appears stated age Head: Normocephalic, without obvious abnormality, atraumatic Neck: no adenopathy, supple, symmetrical, trachea midline and thyroid normal to inspection and palpation Lungs: clear to auscultation bilaterally Breasts: normal appearance, no masses or tenderness, No nipple retraction or dimpling, No nipple discharge or bleeding, No axillary or supraclavicular adenopathy Heart: regular rate and rhythm Abdomen: soft, non-tender, no masses,  no organomegaly Extremities: extremities normal, atraumatic, no cyanosis or edema Skin: Skin color, texture, turgor normal. No rashes or lesions Lymph nodes: Cervical, supraclavicular, and axillary nodes normal. No abnormal inguinal nodes palpated Neurologic: Grossly normal  Pelvic: External genitalia:  no lesions              Urethra:  normal appearing urethra with no masses, tenderness or lesions              Bartholins and Skenes: normal                 Vagina: normal appearing vagina with normal color and discharge, no lesions              Cervix: no lesions                Bimanual Exam:  Uterus:  normal size, contour, position, consistency, mobility, non-tender              Adnexa: no mass, fullness, tenderness              Rectal exam: {yes no:314532}.  Confirms.              Anus:  normal sphincter tone, no lesions  Chaperone was present for exam.  ASSESSMENT     PLAN     An After Visit Summary was printed and given to the patient.  ______ minutes face to face time of which over 50% was spent in counseling.

## 2018-10-01 ENCOUNTER — Other Ambulatory Visit: Payer: Self-pay

## 2018-10-01 ENCOUNTER — Ambulatory Visit: Payer: Self-pay | Admitting: Obstetrics and Gynecology

## 2018-10-01 ENCOUNTER — Ambulatory Visit: Payer: BC Managed Care – PPO | Admitting: Obstetrics and Gynecology

## 2018-10-01 VITALS — BP 108/70 | HR 66 | Temp 97.4°F | Ht 59.5 in | Wt 115.6 lb

## 2018-10-01 DIAGNOSIS — Z30431 Encounter for routine checking of intrauterine contraceptive device: Secondary | ICD-10-CM | POA: Diagnosis not present

## 2018-10-03 ENCOUNTER — Encounter: Payer: Self-pay | Admitting: Obstetrics and Gynecology

## 2018-12-13 ENCOUNTER — Ambulatory Visit: Payer: Self-pay | Admitting: Obstetrics and Gynecology

## 2018-12-31 ENCOUNTER — Encounter: Payer: BLUE CROSS/BLUE SHIELD | Admitting: Physician Assistant

## 2019-01-04 ENCOUNTER — Encounter: Payer: BLUE CROSS/BLUE SHIELD | Admitting: Physician Assistant

## 2019-01-11 ENCOUNTER — Ambulatory Visit (INDEPENDENT_AMBULATORY_CARE_PROVIDER_SITE_OTHER): Payer: Managed Care, Other (non HMO) | Admitting: Physician Assistant

## 2019-01-11 ENCOUNTER — Other Ambulatory Visit: Payer: Self-pay

## 2019-01-11 ENCOUNTER — Encounter: Payer: Self-pay | Admitting: Physician Assistant

## 2019-01-11 VITALS — BP 120/78 | HR 93 | Temp 98.0°F | Ht 59.5 in | Wt 121.5 lb

## 2019-01-11 DIAGNOSIS — E559 Vitamin D deficiency, unspecified: Secondary | ICD-10-CM

## 2019-01-11 DIAGNOSIS — Z136 Encounter for screening for cardiovascular disorders: Secondary | ICD-10-CM

## 2019-01-11 DIAGNOSIS — Z0001 Encounter for general adult medical examination with abnormal findings: Secondary | ICD-10-CM | POA: Diagnosis not present

## 2019-01-11 DIAGNOSIS — Z20822 Contact with and (suspected) exposure to covid-19: Secondary | ICD-10-CM

## 2019-01-11 DIAGNOSIS — Z13220 Encounter for screening for lipoid disorders: Secondary | ICD-10-CM

## 2019-01-11 DIAGNOSIS — G43109 Migraine with aura, not intractable, without status migrainosus: Secondary | ICD-10-CM | POA: Diagnosis not present

## 2019-01-11 DIAGNOSIS — Z20828 Contact with and (suspected) exposure to other viral communicable diseases: Secondary | ICD-10-CM

## 2019-01-11 LAB — COMPREHENSIVE METABOLIC PANEL
ALT: 13 U/L (ref 0–35)
AST: 20 U/L (ref 0–37)
Albumin: 4.5 g/dL (ref 3.5–5.2)
Alkaline Phosphatase: 48 U/L (ref 39–117)
BUN: 14 mg/dL (ref 6–23)
CO2: 27 mEq/L (ref 19–32)
Calcium: 8.9 mg/dL (ref 8.4–10.5)
Chloride: 104 mEq/L (ref 96–112)
Creatinine, Ser: 0.65 mg/dL (ref 0.40–1.20)
GFR: 107.09 mL/min (ref >60.00)
Glucose, Bld: 97 mg/dL (ref 70–99)
Potassium: 3.6 mEq/L (ref 3.5–5.1)
Sodium: 138 mEq/L (ref 135–145)
Total Bilirubin: 0.3 mg/dL (ref 0.2–1.2)
Total Protein: 6.9 g/dL (ref 6.0–8.3)

## 2019-01-11 LAB — VITAMIN D 25 HYDROXY (VIT D DEFICIENCY, FRACTURES): VITD: 43.45 ng/mL (ref 30.00–100.00)

## 2019-01-11 LAB — CBC WITH DIFFERENTIAL/PLATELET
Basophils Absolute: 0.1 10*3/uL (ref 0.0–0.1)
Basophils Relative: 0.6 % (ref 0.0–3.0)
Eosinophils Absolute: 0.1 10*3/uL (ref 0.0–0.7)
Eosinophils Relative: 0.8 % (ref 0.0–5.0)
HCT: 40 % (ref 36.0–46.0)
Hemoglobin: 13.4 g/dL (ref 12.0–15.0)
Lymphocytes Relative: 22.6 % (ref 12.0–46.0)
Lymphs Abs: 2 10*3/uL (ref 0.7–4.0)
MCHC: 33.7 g/dL (ref 30.0–36.0)
MCV: 88.9 fl (ref 78.0–100.0)
Monocytes Absolute: 0.5 10*3/uL (ref 0.1–1.0)
Monocytes Relative: 5.8 % (ref 3.0–12.0)
Neutro Abs: 6.1 10*3/uL (ref 1.4–7.7)
Neutrophils Relative %: 70.2 % (ref 43.0–77.0)
Platelets: 195 10*3/uL (ref 150.0–400.0)
RBC: 4.5 Mil/uL (ref 3.87–5.11)
RDW: 12.6 % (ref 11.5–15.5)
WBC: 8.7 10*3/uL (ref 4.0–10.5)

## 2019-01-11 LAB — LIPID PANEL
Cholesterol: 205 mg/dL — ABNORMAL HIGH (ref 0–200)
HDL: 74.4 mg/dL (ref >39.00)
LDL Cholesterol: 108 mg/dL — ABNORMAL HIGH (ref 0–99)
NonHDL: 130.93
Total CHOL/HDL Ratio: 3
Triglycerides: 115 mg/dL (ref 0.0–149.0)
VLDL: 23 mg/dL (ref 0.0–40.0)

## 2019-01-11 MED ORDER — SUMATRIPTAN SUCCINATE 25 MG PO TABS: 25.0000 mg | ORAL_TABLET | ORAL | 0 refills | Status: DC | PRN

## 2019-01-11 NOTE — Patient Instructions (Addendum)
It was great to see you!  May use Imitrex for migraines.  Please be safe in [place]!! Good luck with the move to [place]!  Please go to the lab for blood work.   Our office will call you with your results unless you have chosen to receive results via MyChart.  If your blood work is normal we will follow-up each year for physicals and as scheduled for chronic medical problems.  If anything is abnormal we will treat accordingly and get you in for a follow-up.  Take care,  [facility] Maintenance, Female Adopting a healthy lifestyle and getting preventive care are important in promoting health and wellness. Ask your health care provider about:  The right schedule for you to have regular tests and exams.  Things you can do on your own to prevent diseases and keep yourself healthy. What should I know about diet, weight, and exercise? Eat a healthy diet   Eat a diet that includes plenty of vegetables, fruits, low-fat dairy products, and lean protein.  Do not eat a lot of foods that are high in solid fats, added sugars, or sodium. Maintain a healthy weight Body mass index (BMI) is used to identify weight problems. It estimates body fat based on height and weight. Your health care provider can help determine your BMI and help you achieve or maintain a healthy weight. Get regular exercise Get regular exercise. This is one of the most important things you can do for your health. Most adults should:  Exercise for at least 150 minutes each week. The exercise should increase your heart rate and make you sweat (moderate-intensity exercise).  Do strengthening exercises at least twice a week. This is in addition to the moderate-intensity exercise.  Spend less time sitting. Even light physical activity can be beneficial. Watch cholesterol and blood lipids Have your blood tested for lipids and cholesterol at 30 years of age, then have this test every 5 years. Have your cholesterol  levels checked more often if:  Your lipid or cholesterol levels are high.  You are older than 30 years of age.  You are at high risk for heart disease. What should I know about cancer screening? Depending on your health history and family history, you may need to have cancer screening at various ages. This may include screening for:  Breast cancer.  Cervical cancer.  Colorectal cancer.  Skin cancer.  Lung cancer. What should I know about heart disease, diabetes, and high blood pressure? Blood pressure and heart disease  High blood pressure causes heart disease and increases the risk of stroke. This is more likely to develop in people who have high blood pressure readings, are of African descent, or are overweight.  Have your blood pressure checked: ? Every 3-5 years if you are 44-35 years of age. ? Every year if you are 67 years old or older. Diabetes Have regular diabetes screenings. This checks your fasting blood sugar level. Have the screening done:  Once every three years after age 34 if you are at a normal weight and have a low risk for diabetes.  More often and at a younger age if you are overweight or have a high risk for diabetes. What should I know about preventing infection? Hepatitis B If you have a higher risk for hepatitis B, you should be screened for this virus. Talk with your health care provider to find out if you are at risk for hepatitis B infection. Hepatitis C Testing is  recommended for:  Everyone born from [birth year] through 1967.  Anyone with known risk factors for hepatitis C. Sexually transmitted infections (STIs)  Get screened for STIs, including gonorrhea and chlamydia, if: ? You are sexually active and are younger than 30 years of age. ? You are older than 30 years of age and your health care provider tells you that you are at risk for this type of infection. ? Your sexual activity has changed since you were last screened, and you are at increased  risk for chlamydia or gonorrhea. Ask your health care provider if you are at risk.  Ask your health care provider about whether you are at high risk for HIV. Your health care provider may recommend a prescription medicine to help prevent HIV infection. If you choose to take medicine to prevent HIV, you should first get tested for HIV. You should then be tested every 3 months for as long as you are taking the medicine. Pregnancy  If you are about to stop having your period (premenopausal) and you may become pregnant, seek counseling before you get pregnant.  Take 400 to 800 micrograms (mcg) of folic acid every day if you become pregnant.  Ask for birth control (contraception) if you want to prevent pregnancy. Osteoporosis and menopause Osteoporosis is a disease in which the bones lose minerals and strength with aging. This can result in bone fractures. If you are 34 years old or older, or if you are at risk for osteoporosis and fractures, ask your health care provider if you should:  Be screened for bone loss.  Take a calcium or vitamin D supplement to lower your risk of fractures.  Be given hormone replacement therapy (HRT) to treat symptoms of menopause. Follow these instructions at home: Lifestyle  Do not use any products that contain nicotine or tobacco, such as cigarettes, e-cigarettes, and chewing tobacco. If you need help quitting, ask your health care provider.  Do not use street drugs.  Do not share needles.  Ask your health care provider for help if you need support or information about quitting drugs. Alcohol use  Do not drink alcohol if: ? Your health care provider tells you not to drink. ? You are pregnant, may be pregnant, or are planning to become pregnant.  If you drink alcohol: ? Limit how much you use to 0-1 drink a day. ? Limit intake if you are breastfeeding.  Be aware of how much alcohol is in your drink. In the [place] one drink equals one 12 oz bottle of beer  (355 mL), one 5 oz glass of wine (148 mL), or one 1 oz glass of hard liquor (44 mL). General instructions  Schedule regular health, dental, and eye exams.  Stay current with your vaccines.  Tell your health care provider if: ? You often feel depressed. ? You have ever been abused or do not feel safe at home. Summary  Adopting a healthy lifestyle and getting preventive care are important in promoting health and wellness.  Follow your health care provider's instructions about healthy diet, exercising, and getting tested or screened for diseases.  Follow your health care provider's instructions on monitoring your cholesterol and blood pressure. This information is not intended to replace advice given to you by your health care provider. Make sure you discuss any questions you have with your health care provider. Document Released: 08/23/2010 Document Revised: 01/31/2018 Document Reviewed: 01/31/2018 Elsevier Patient Education  2020 [employer].

## 2019-01-11 NOTE — Progress Notes (Signed)
I acted as a Neurosurgeon for [employer], PA-C [employer], LPN  Subjective:    Norma Garcia is a 28 y.o. female and is here for a comprehensive physical exam.  HPI  There are no preventive care reminders to display for this patient.  Acute Concerns: Migraines -- patient reports that she is getting migraines the day before her period.  She states that she usually takes Tylenol or ibuprofen and has never helped with her migraines.  She sometimes gets auras.  She has never been on a prescription medication for her migraines. Covid antibody testing --she is wondering if she can have Covid antibody testing, she has been sick a few times and wondering if she had Covid.  She is not having any symptoms whatsoever.  Chronic Issues: Vitamin D deficiency -- she is currently taking vitamin D as she prepares for a trip to [place].  She would like her labs checked. Anxiety -- she is currently well managed with her Effexor 37.5 mg daily.  She has used Atarax a few times to help her for sleep and panic attacks.  Health Maintenance: Immunizations -- UTD, declines Flu Colonoscopy -- N/A Mammogram -- N/A PAP --UTD, done 08/15/17 NILM Bone Density -- N/A Diet --overall good intake of all food groups Caffeine intake --does drink espresso daily Sleep habits --no problems Exercise --as able, does chase after her 2 kids Current Weight -- Weight: 121 lb 8 oz (55.1 kg)  Weight History: Wt Readings from Last 10 Encounters:  01/11/19 121 lb 8 oz (55.1 kg)  10/01/18 115 lb 9.6 oz (52.4 kg)  09/12/18 115 lb (52.2 kg)  09/06/18 118 lb 9.6 oz (53.8 kg)  08/01/18 119 lb (54 kg)  07/25/18 117 lb (53.1 kg)  07/23/18 115 lb (52.2 kg)  12/27/17 119 lb (54 kg)  01/04/17 118 lb 4 oz (53.6 kg)  06/18/15 145 lb (65.8 kg)   Mood --good Patient's last menstrual period was 01/10/2019. Period characteristics --does get heavy periods with her copper IUD Birth control --copper IUD  Depression screen [facility]  2/9 01/11/2019  Decreased Interest 3  Down, Depressed, Hopeless 0  PHQ - 2 Score 3  Altered sleeping 0  Tired, decreased energy 3  Change in appetite 3  Feeling bad or failure about yourself  0  Trouble concentrating 0  Moving slowly or fidgety/restless 0  Suicidal thoughts 0  PHQ-9 Score 9  Difficult doing work/chores Not difficult at all     Other providers/specialists: Patient Care Team: Jarold Motto, [place] as PCP - General (Physician Assistant) Patient, No Pcp Per (General Practice)   PMHx, SurgHx, SocialHx, Medications, and Allergies were reviewed in the Visit Navigator and updated as appropriate.   Past Medical History:  Diagnosis Date  . Anxiety   . 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 Headache    occasional migraines during second trimester; none during third trimester  . History of chicken pox   . Hx of urinary tract infection   . Keloid   . Postpartum care following cesarean delivery (4/28) 06/19/2013  . Postpartum care following VBAC delivery (4/27) 06/18/2015     Past Surgical History:  Procedure Laterality Date  . CESAREAN SECTION N/A 06/18/2013   Procedure: CESAREAN SECTION;  Surgeon: Genia Del, MD;  Location: [facility];  Service: Obstetrics;  Laterality: N/A;  . CESAREAN SECTION  2015     Family History  Problem Relation Age of Onset  . Hypertension Father   . Hyperlipidemia Father   .  Thyroid nodules Sister   . Diabetes Paternal Grandmother   . Breast cancer Neg Hx   . Colon cancer Neg Hx     Social History   Tobacco Use  . Smoking status: Never Smoker  . Smokeless tobacco: Never Used  Substance Use Topics  . Alcohol use: Yes    Frequency: Never    Comment: socially  . Drug use: No    Review of Systems:   Review of Systems  Constitutional: Negative for chills, fever, malaise/fatigue and weight loss.  HENT: Negative for hearing loss, sinus pain and sore throat.   Eyes: Negative for blurred vision.  Respiratory: Negative  for cough and shortness of breath.   Cardiovascular: Negative for chest pain, palpitations and leg swelling.  Gastrointestinal: Negative for abdominal pain, constipation, diarrhea, heartburn, nausea and vomiting.  Genitourinary: Negative for dysuria, frequency and urgency.  Musculoskeletal: Negative for back pain, myalgias and neck pain.  Skin: Negative for itching and rash.  Neurological: Negative for dizziness, tingling, seizures, loss of consciousness and headaches.  Endo/Heme/Allergies: Negative for polydipsia.  Psychiatric/Behavioral: Negative for depression. The patient is not nervous/anxious.   All other systems reviewed and are negative.   Objective:   BP 120/78 (BP Location: Left Arm, Patient Position: Sitting, Cuff Size: Normal)   Pulse 93   Temp 98 F (36.7 C) (Temporal)   Ht 4' 11.5" (1.511 m)   Wt 121 lb 8 oz (55.1 kg)   LMP 01/10/2019   SpO2 97%   BMI 24.13 kg/m   General Appearance:    Alert, cooperative, no distress, appears stated age  Head:    Normocephalic, without obvious abnormality, atraumatic  Eyes:    PERRL, conjunctiva/corneas clear, EOM's intact, fundi    benign, both eyes  Ears:    Normal TM's and external ear canals, both ears  Nose:   Nares normal, septum midline, mucosa normal, no drainage    or sinus tenderness  Throat:   Lips, mucosa, and tongue normal; teeth and gums normal  Neck:   Supple, symmetrical, trachea midline, no adenopathy;    thyroid:  no enlargement/tenderness/nodules; no carotid   bruit or JVD  Back:     Symmetric, no curvature, ROM normal, no CVA tenderness  Lungs:     Clear to auscultation bilaterally, respirations unlabored  Chest Wall:    Deferred   Heart:    Regular rate and rhythm, S1 and S2 normal, no murmur, rub   or gallop  Breast Exam:    No tenderness, masses, or nipple abnormality  Abdomen:     Soft, non-tender, bowel sounds active all four quadrants,    no masses, no organomegaly  Genitalia:    Deferred  Rectal:     Deferred  Extremities:   Extremities normal, atraumatic, no cyanosis or edema  Pulses:   2+ and symmetric all extremities  Skin:   Skin color, texture, turgor normal, no rashes or lesions  Lymph nodes:   Cervical, supraclavicular, and axillary nodes normal  Neurologic:   CNII-XII intact, normal strength, sensation and reflexes    throughout   Results for orders placed or performed in visit on 08/01/18  POCT urine pregnancy  Result Value Ref Range   Preg Test, Ur Negative Negative    Assessment/Plan:   Norma Garcia was seen today for annual exam.  Diagnoses and all orders for this visit:  Encounter for general adult medical examination with abnormal findings Today patient counseled on age appropriate routine health concerns for screening and prevention,  each reviewed and up to date or declined. Immunizations reviewed and up to date or declined. Labs ordered and reviewed. Risk factors for depression reviewed and negative. Hearing function and visual acuity are intact. ADLs screened and addressed as needed. Functional ability and level of safety reviewed and appropriate. Education, counseling and referrals performed based on assessed risks today. Patient provided with a copy of personalized plan for preventive services. -     CBC with Differential/Platelet -     Comprehensive metabolic panel  Encounter for lipid screening for cardiovascular disease -     Lipid panel  Encounter for screening laboratory testing for COVID-19 virus in asymptomatic patient -     SAR CoV2 Serology (COVID 19)AB(IGG)IA  Vitamin D deficiency -     VITAMIN D 25 Hydroxy (Vit-D Deficiency, Fractures)  Migraine with aura and without status migrainosus, not intractable No red flags on exam, will trial Imitrex 25 mg as needed for migraines.  Recommend she follow-up if symptoms worsen or persist despite treatment.  Other orders -     Cancel: Vitamin B12 -     SUMAtriptan (IMITREX) 25 MG tablet; Take 1 tablet (25 mg  total) by mouth every 2 (two) hours as needed for migraine. May repeat in 2 hours if headache persists or recurs.  Well Adult Exam: Labs ordered: Yes. Patient counseling was done. See below for items discussed. Discussed the patient's BMI. The BMI is in the acceptable range Follow up as needed for acute illness.  Patient Counseling:   [x]     Nutrition: Stressed importance of moderation in sodium/caffeine intake, saturated fat and cholesterol, caloric balance, sufficient intake of fresh fruits, vegetables, fiber, calcium, iron, and 1 mg of folate supplement per day (for females capable of pregnancy).   [x]      Stressed the importance of regular exercise.    [x]     Substance Abuse: Discussed cessation/primary prevention of tobacco, alcohol, or other drug use; driving or other dangerous activities under the influence; availability of treatment for abuse.    [x]      Injury prevention: Discussed safety belts, safety helmets, smoke detector, smoking near bedding or upholstery.    [x]      Sexuality: Discussed sexually transmitted diseases, partner selection, use of condoms, avoidance of unintended pregnancy  and contraceptive alternatives.    [x]     Dental health: Discussed importance of regular tooth brushing, flossing, and dental visits.   [x]      Health maintenance and immunizations reviewed. Please refer to Health maintenance section.   CMA or LPN served as scribe during this visit. History, Physical, and Plan performed by medical provider. The above documentation has been reviewed and is accurate and complete.   Jarold MottoSamantha Evone Arseneau, PA-C [facility]

## 2019-01-12 ENCOUNTER — Encounter: Payer: Self-pay | Admitting: Physician Assistant

## 2019-01-12 LAB — SAR COV2 SEROLOGY (COVID19)AB(IGG),IA: SARS CoV2 AB IGG: NEGATIVE

## 2019-05-01 ENCOUNTER — Encounter: Payer: Self-pay | Admitting: Obstetrics and Gynecology

## 2019-05-08 ENCOUNTER — Telehealth: Payer: Self-pay

## 2019-05-08 NOTE — Telephone Encounter (Signed)
River, Barton Fanny to Patton Salles, MD     05/01/19 2:22 PM Hi!  We moved, so I updated my pharmacy, how it works now for me to have the refill on my new pharmacy address?  Thanks, [employer] with pt. Pt has moved to [place], [place] and has since called pharmacy and transferred Rx to new pharmacy on file. Pt states will pick up Rx today. Thankful for call back.   Encounter closed.

## 2019-05-30 ENCOUNTER — Other Ambulatory Visit: Payer: Self-pay | Admitting: Physician Assistant

## 2019-05-30 MED ORDER — SUMATRIPTAN SUCCINATE 25 MG PO TABS: 25.0000 mg | ORAL_TABLET | ORAL | 0 refills | Status: DC | PRN

## 2019-06-03 ENCOUNTER — Telehealth: Payer: Self-pay | Admitting: Physician Assistant

## 2019-06-03 NOTE — Telephone Encounter (Signed)
Noted  

## 2019-06-03 NOTE — Telephone Encounter (Signed)
Nurse Assessment Nurse: Vear Clock, RN, Elease Hashimoto Date/Time Norma Garcia Time): 06/03/2019 11:02:48 AM Confirm and document reason for call. If symptomatic, describe symptoms. ---Caller states she is having side effects from the covid vaccine, arm is very swollen and sever ear pain, no fever, her neck to the side and has headaches and body chills. She had her 2nd does yesterday. Has the patient had close contact with a person known or suspected to have the novel coronavirus illness OR traveled / lives in area with major community spread (including international travel) in the last 14 days from the onset of symptoms? * If Asymptomatic, screen for exposure and travel within the last 14 days. ---No Does the patient have any new or worsening symptoms? ---Yes Will a triage be completed? ---Yes Related visit to physician within the last 2 weeks? ---No Does the PT have any chronic conditions? (i.e. diabetes, asthma, this includes High risk factors for pregnancy, etc.) ---No Is the patient pregnant or possibly pregnant? (Ask all females between the ages of 49-55) ---No Is this a behavioral health or substance abuse call? ---NoPLEASE NOTE: All timestamps contained within this report are represented as [place] Standard Time. CONFIDENTIALTY NOTICE: This fax transmission is intended only for the addressee. It contains information that is legally privileged, confidential or otherwise protected from use or disclosure. If you are not the intended recipient, you are strictly prohibited from reviewing, disclosing, copying using or disseminating any of this information or taking any action in reliance on or regarding this information. If you have received this fax in error, please notify us immediately by telephone so that we can arrange for its return to [place]. Phone: [phone], Toll-Free: [phone], Fax: [phone] Page: 2 of 2 Call Id: [id number] Guidelines Guideline Title Affirmed Question Affirmed Notes  Nurse Date/Time Norma Garcia Time) Neck Pain or Stiffness [1] Stiff neck (can't put chin to chest) AND [2] headache Norma Garcia 06/03/2019 11:05:30 AM Disp. Time Norma Garcia Time) Disposition Final User 06/03/2019 11:08:19 AM Go to ED Now Yes Vear Clock, RN, Patrici

## 2019-07-29 ENCOUNTER — Other Ambulatory Visit: Payer: Self-pay

## 2019-07-29 NOTE — Telephone Encounter (Signed)
Medication refill request: Venlafaxine Last AEX:  09/12/18 Dr. Edward Jolly Next AEX: 09/17/19 Last MMG (if hormonal medication request): n/a Refill authorized: Please advise; Order pended #30 w/1 refill if authorized

## 2019-07-30 MED ORDER — VENLAFAXINE HCL ER 37.5 MG PO CP24: 37.5000 mg | ORAL_CAPSULE | Freq: Every day | ORAL | 1 refills | Status: DC

## 2019-08-12 ENCOUNTER — Encounter: Payer: Self-pay | Admitting: Physician Assistant

## 2019-08-13 ENCOUNTER — Encounter: Payer: Self-pay | Admitting: Physician Assistant

## 2019-08-13 ENCOUNTER — Telehealth (INDEPENDENT_AMBULATORY_CARE_PROVIDER_SITE_OTHER): Payer: Managed Care, Other (non HMO) | Admitting: Physician Assistant

## 2019-08-13 ENCOUNTER — Other Ambulatory Visit: Payer: Self-pay

## 2019-08-13 DIAGNOSIS — F418 Other specified anxiety disorders: Secondary | ICD-10-CM

## 2019-08-13 MED ORDER — FLUOXETINE HCL 10 MG PO CAPS
10.0000 mg | ORAL_CAPSULE | Freq: Every day | ORAL | 1 refills | Status: DC
Start: 2019-08-13 — End: 2019-09-04

## 2019-08-13 MED ORDER — HYDROXYZINE HCL 25 MG PO TABS: 25.0000 mg | ORAL_TABLET | Freq: Every evening | ORAL | 0 refills | Status: DC | PRN

## 2019-08-13 NOTE — Progress Notes (Signed)
Virtual Visit via Video   I connected with Norma Garcia on 08/13/19 at 11:30 AM EDT by a video enabled telemedicine application and verified that I am speaking with the correct person using two identifiers. Location patient: Home Location provider: [facility], Office Persons participating in the virtual visit: Naileah Karg Ottey, Jarold Motto PA-C, Corky Mull, LPN   I discussed the limitations of evaluation and management by telemedicine and the availability of in person appointments. The patient expressed understanding and agreed to proceed.  I acted as a Neurosurgeon for [employer], [employer], LPN  Subjective:   HPI:   Anxiety Pt c/o increase in anxiety the past 10 days. Pt is using Venlafaxine XR 37.5 mg daily and feels like this medication may be making her symptoms worse. She would like to discontinue this medication. She has never been on any other medications. She has atarax to use for sleeping but doesn't use it for panic attacks.  Denies SI/HI.  Patient's last menstrual period was 07/25/2019.  GAD 7 : Generalized Anxiety Score 08/13/2019 01/11/2019 07/25/2018  Nervous, Anxious, on Edge 3 3 2   Control/stop worrying 0 0 2  Worry too much - different things 0 0 2  Trouble relaxing 3 3 1   Restless 1 0 2  Easily annoyed or irritable 3 3 3   Afraid - awful might happen 0 0 3  Total GAD 7 Score 10 9 15   Anxiety Difficulty Somewhat difficult Not difficult at all Very difficult      ROS: See pertinent positives and negatives per HPI.  Patient Active Problem List   Diagnosis Date Noted  . Migraine with aura and without status migrainosus, not intractable 01/11/2019  . Vitamin D deficiency 01/11/2019  . Situational anxiety 08/02/2018    Social History   Tobacco Use  . Smoking status: Never Smoker  . Smokeless tobacco: Never Used  Substance Use Topics  . Alcohol use: Yes    Comment: socially    Current Outpatient Medications:  .  ascorbic  acid (VITAMIN C) 250 MG tablet, Take 250 mg by mouth daily., Disp: , Rfl:  .  ibuprofen (ADVIL,MOTRIN) 200 MG tablet, Take 400 mg as needed by mouth., Disp: , Rfl:  .  Multiple Vitamin (MULTIVITAMIN) tablet, Take 1 tablet by mouth daily., Disp: , Rfl:  .  PARAGARD INTRAUTERINE COPPER IU, by Intrauterine route. IUD was placed 07/2015 by GYN, needs to be removed in 07/2025., Disp: , Rfl:  .  SUMAtriptan (IMITREX) 25 MG tablet, Take 1 tablet (25 mg total) by mouth every 2 (two) hours as needed for migraine. May repeat in 2 hours if headache persists or recurs., Disp: 10 tablet, Rfl: 0 .  venlafaxine XR (EFFEXOR XR) 37.5 MG 24 hr capsule, Take 1 capsule (37.5 mg total) by mouth daily., Disp: 30 capsule, Rfl: 1 .  FLUoxetine (PROZAC) 10 MG capsule, Take 1 capsule (10 mg total) by mouth daily., Disp: 30 capsule, Rfl: 1 .  hydrOXYzine (ATARAX/VISTARIL) 25 MG tablet, Take 1 tablet (25 mg total) by mouth at bedtime as needed for anxiety (insomnia). Take one 25 mg tablet 30-60 minutes prior to bedtime for insomnia, anxiety. May increase to two tablets., Disp: 30 tablet, Rfl: 0  Allergies  Allergen Reactions  . Reglan [Metoclopramide] Anxiety    Objective:   VITALS: Per patient if applicable, see vitals. GENERAL: Alert, appears well and in no acute distress. HEENT: Atraumatic, conjunctiva clear, no obvious abnormalities on inspection of external nose and ears. NECK:  Normal movements of the head and neck. CARDIOPULMONARY: No increased WOB. Speaking in clear sentences. I:E ratio WNL.  MS: Moves all visible extremities without noticeable abnormality. PSYCH: Pleasant and cooperative, well-groomed. Speech normal rate and rhythm. Affect is appropriate. Insight and judgement are appropriate. Attention is focused, linear, and appropriate.  NEURO: CN grossly intact. Oriented as arrived to appointment on time with no prompting. Moves both UE equally.  SKIN: No obvious lesions, wounds, erythema, or cyanosis noted on  face or hands.  Assessment and Plan:   [name] was seen today for anxiety.  Diagnoses and all orders for this visit:  Situational anxiety Uncontrolled. She feels like she is ready to trial coming off the medication. Will stop Effexor 37.5 mg. Start 10 mg Prozac daily x 2 weeks then discontinue. May resume Prozac 10 mg and take every other day if she feels like she needs this medication to manage her withdrawal symptoms. Follow-up in 2 weeks, sooner if concerns. I discussed with patient that if they develop any SI, to tell someone immediately and seek medical attention.  Other orders -     FLUoxetine (PROZAC) 10 MG capsule; Take 1 capsule (10 mg total) by mouth daily. -     hydrOXYzine (ATARAX/VISTARIL) 25 MG tablet; Take 1 tablet (25 mg total) by mouth at bedtime as needed for anxiety (insomnia). Take one 25 mg tablet 30-60 minutes prior to bedtime for insomnia, anxiety. May increase to two tablets.  . Reviewed expectations re: course of current medical issues. . Discussed self-management of symptoms. . Outlined signs and symptoms indicating need for more acute intervention. . Patient verbalized understanding and all questions were answered. Marland Kitchen Health Maintenance issues including appropriate healthy diet, exercise, and smoking avoidance were discussed with patient. . See orders for this visit as documented in the electronic medical record.  I discussed the assessment and treatment plan with the patient. The patient was provided an opportunity to ask questions and all were answered. The patient agreed with the plan and demonstrated an understanding of the instructions.   The patient was advised to call back or seek an in-person evaluation if the symptoms worsen or if the condition fails to improve as anticipated.   CMA or LPN served as scribe during this visit. History, Physical, and Plan performed by medical provider. The above documentation has been reviewed and is accurate and  complete.  [place], [place] 08/13/2019

## 2019-08-20 ENCOUNTER — Other Ambulatory Visit: Payer: Self-pay | Admitting: Physician Assistant

## 2019-08-31 IMAGING — US ULTRASOUND LEFT BREAST LIMITED
1 series · 6 of 6 positions shown · non-contrast
Comparison: None

CLINICAL DATA: 26-year-old patient presents for bilateral breast
ultrasound for complaint of cyclical breast bilateral axillary
tenderness related to her menstrual cycle. She denies any breast or
axillary lumps.

EXAM:
ULTRASOUND OF THE BILATERAL BREAST

[Series 1: ultrasound left breast limited · 0.07mm/px · 6 of 6 slices shown]
[im 1/6]
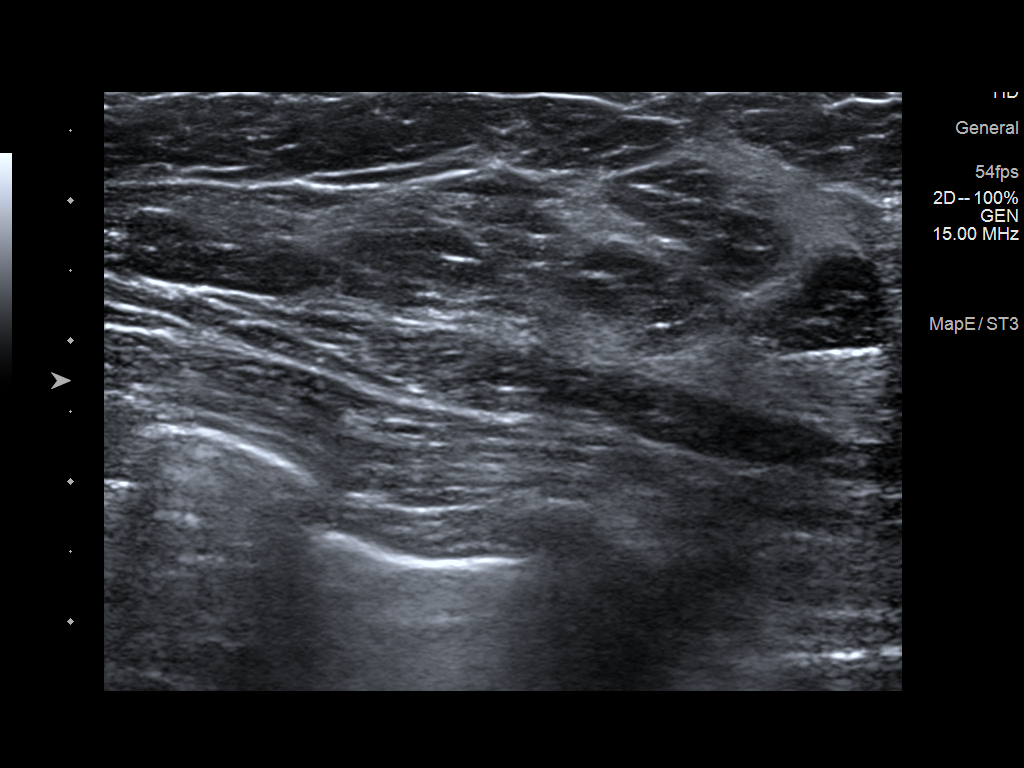
[im 2/6]
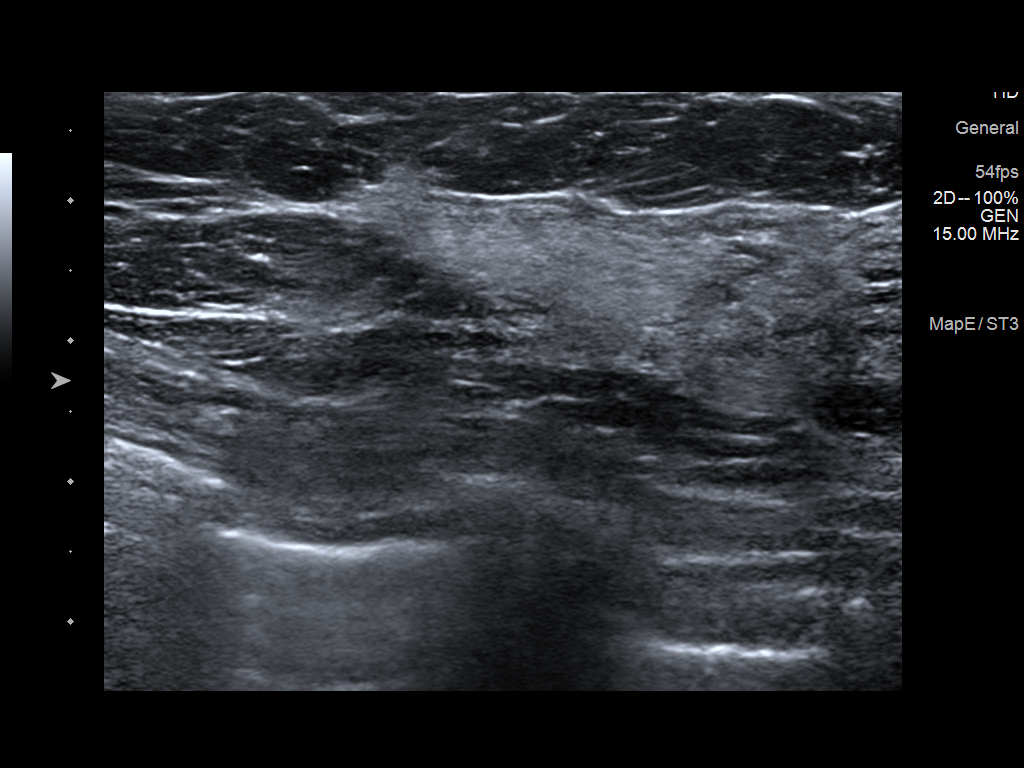
[im 3/6]
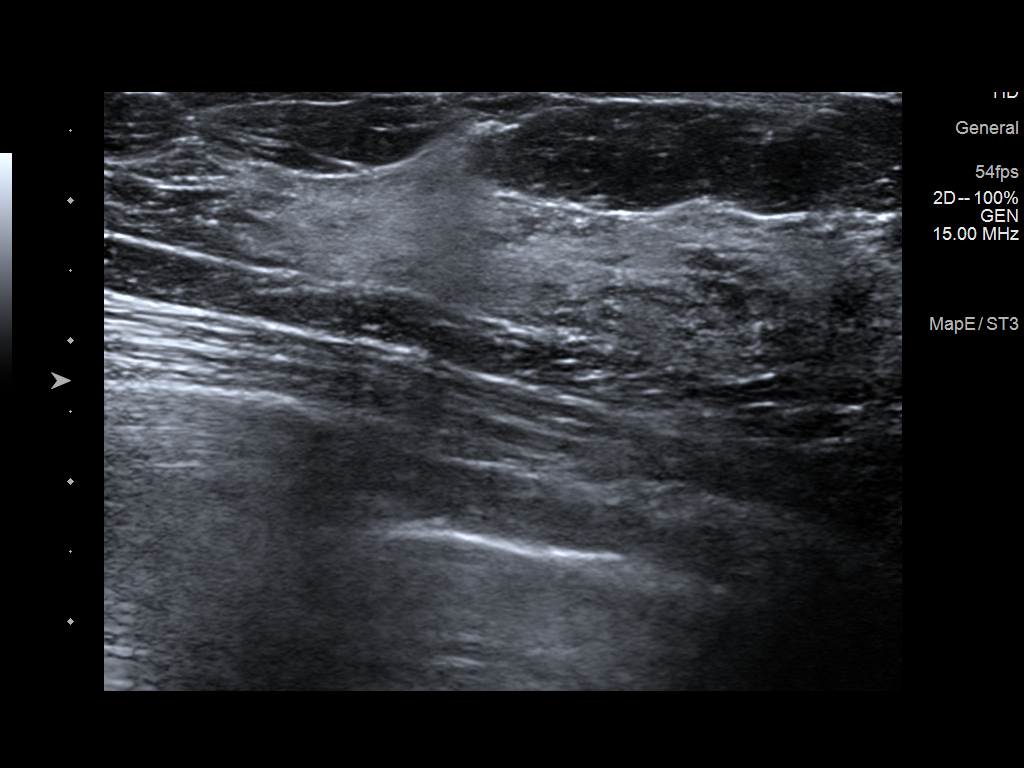
[im 4/6]
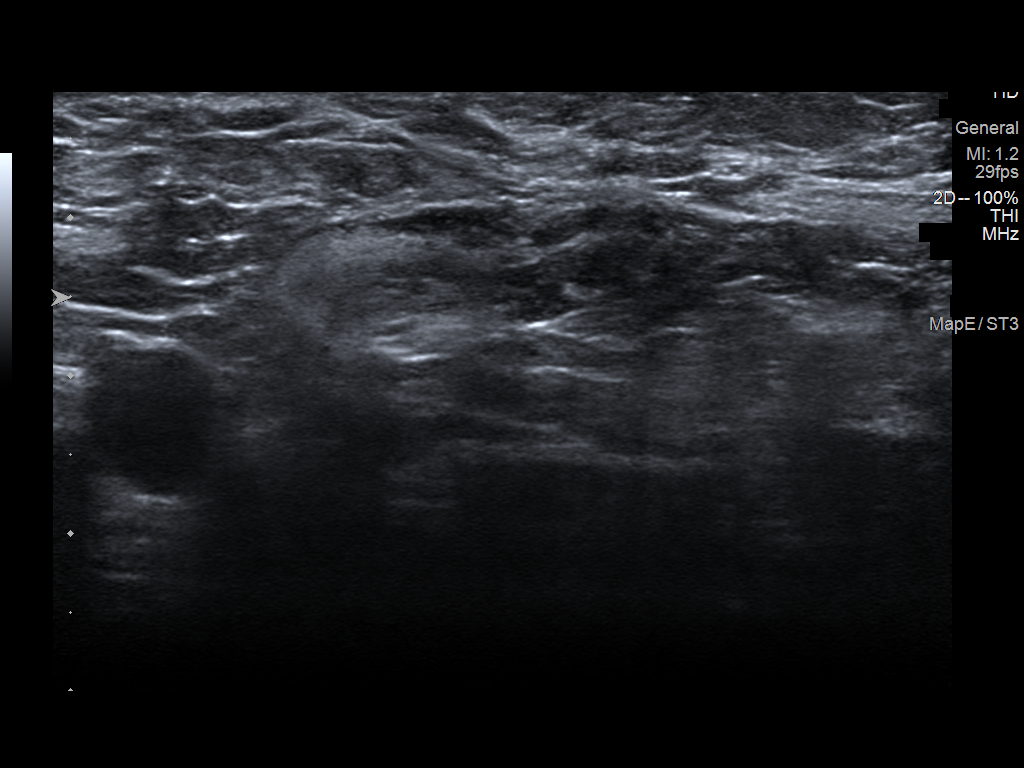
[im 5/6]
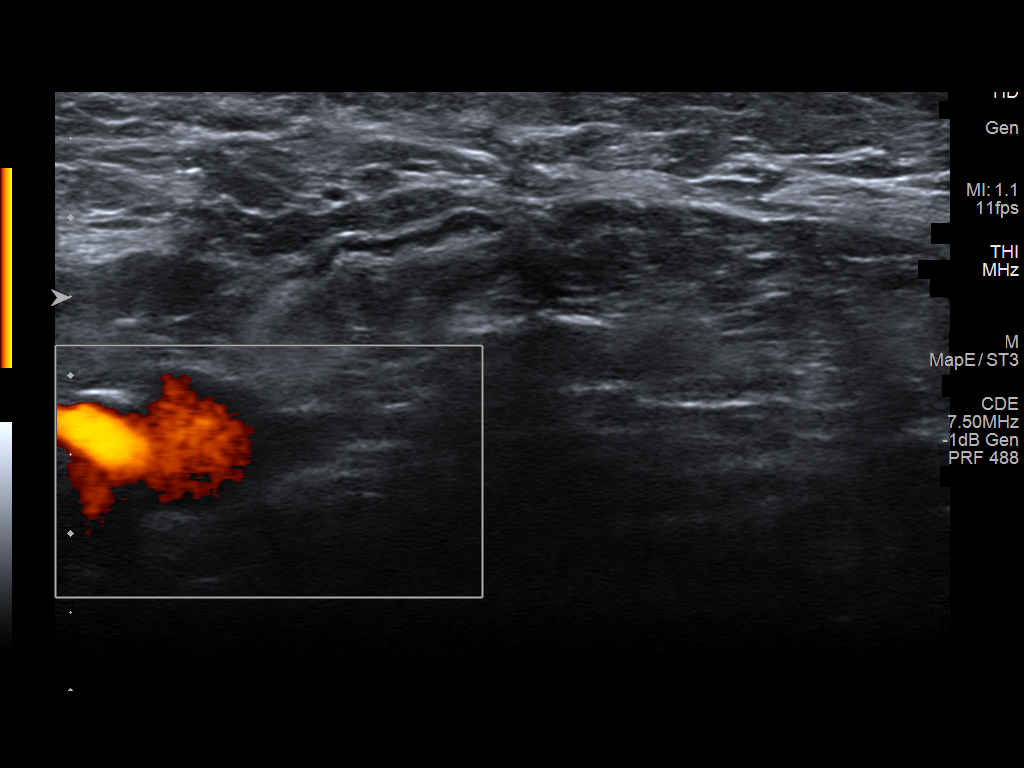
[im 6/6]
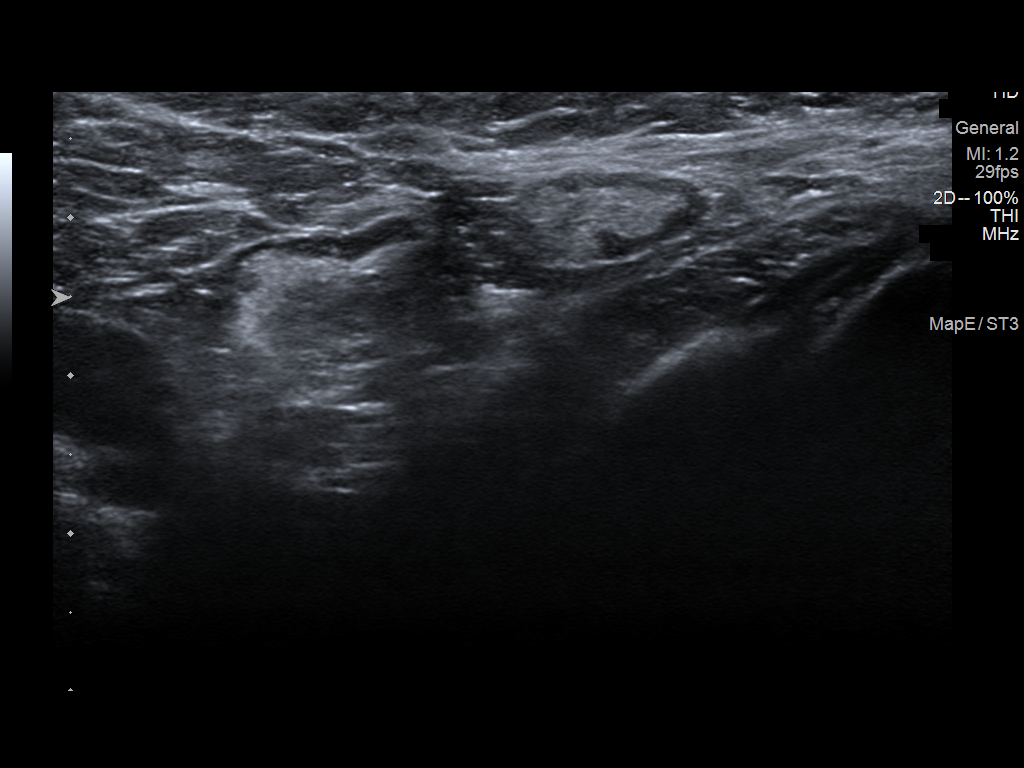

[6 of 6 positions shown; findings below may reference images not displayed]

FINDINGS: Targeted ultrasound is performed, showing normal fibroglandular
tissue in the regions of patient tenderness in the upper central
left breast, upper central right breast, and lower inner quadrant of
the right breast.

The bilateral axillary regions are negative. Negative for
lymphadenopathy.
IMPRESSION: No sonographic evidence of malignancy in either breast. Negative for
axillary lymphadenopathy bilaterally.

RECOMMENDATION:
Screening mammogram at age 38 unless there are persistent or
intervening clinical concerns. (Code:SI-S-ZKY)

I have discussed the findings and recommendations with the patient.
Results were also provided in writing at the conclusion of the
visit. If applicable, a reminder letter will be sent to the patient
regarding the next appointment.

BI-RADS CATEGORY  1: Negative.

## 2019-08-31 IMAGING — US ULTRASOUND RIGHT BREAST LIMITED
1 series · 7 of 7 positions shown · non-contrast
Comparison: None

CLINICAL DATA: 26-year-old patient presents for bilateral breast
ultrasound for complaint of cyclical breast bilateral axillary
tenderness related to her menstrual cycle. She denies any breast or
axillary lumps.

EXAM:
ULTRASOUND OF THE BILATERAL BREAST

[Series 1: ultrasound right breast limited · 0.07mm/px · 7 of 7 slices shown]
[im 1/7]
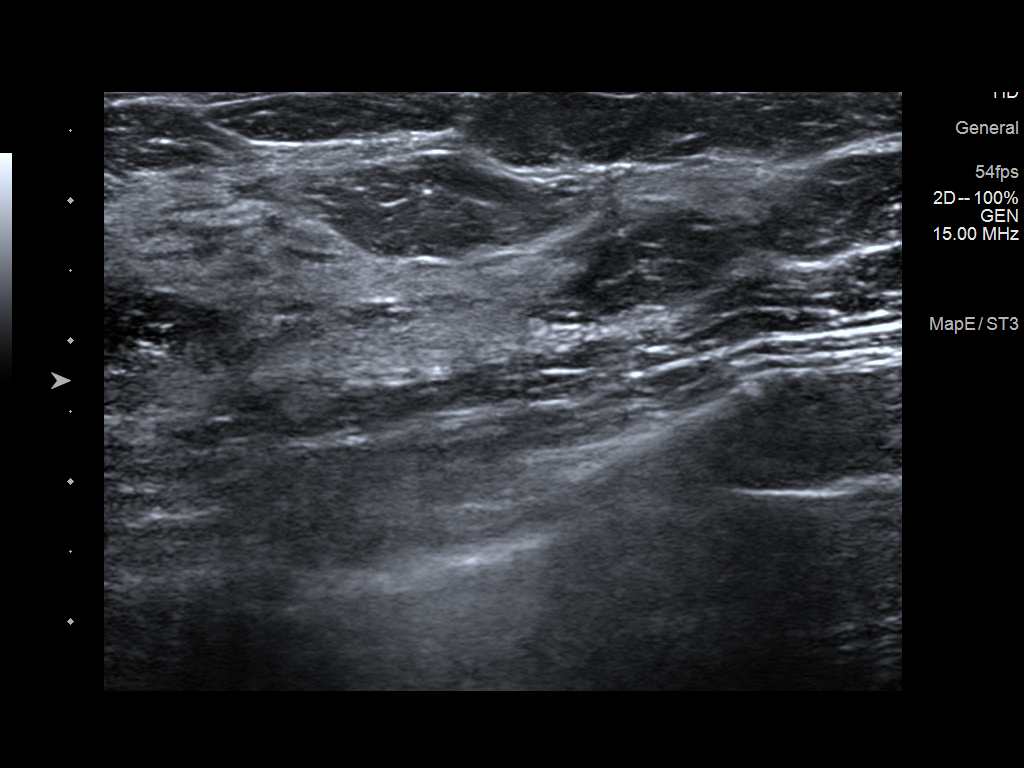
[im 2/7]
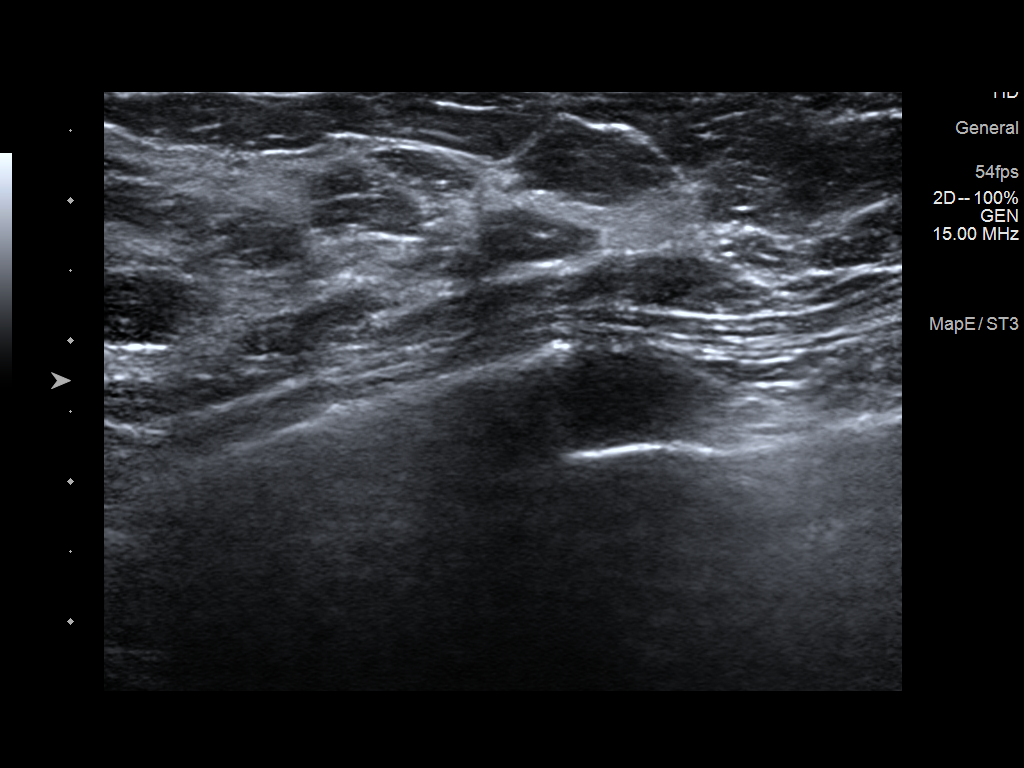
[im 3/7]
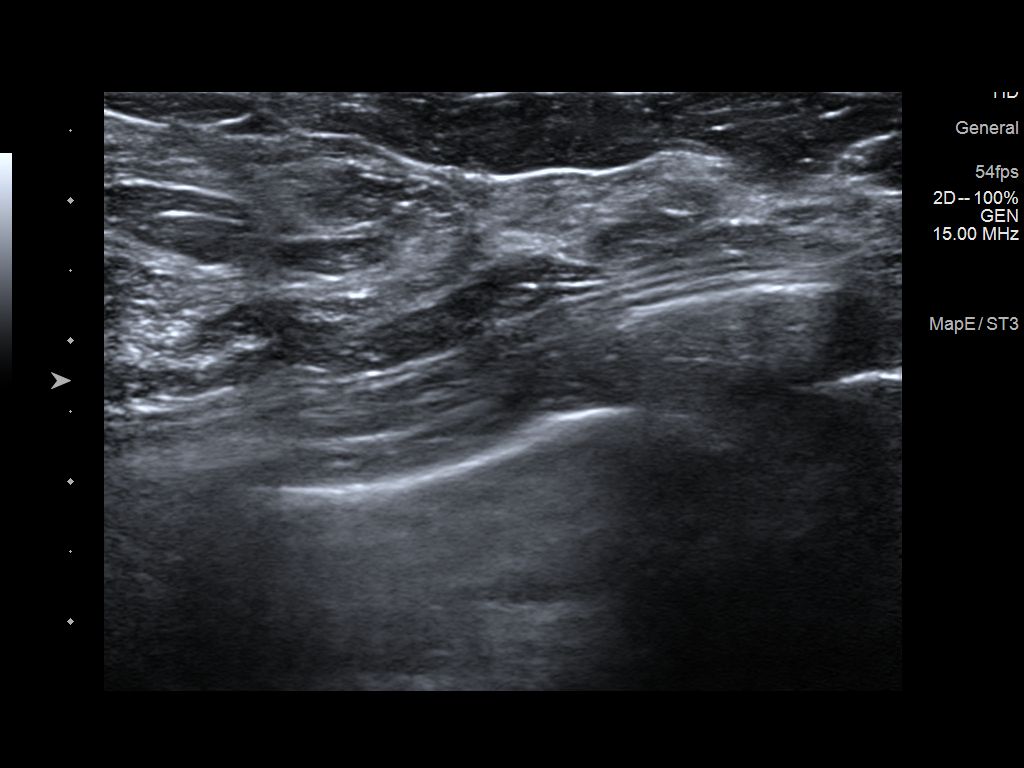
[im 4/7]
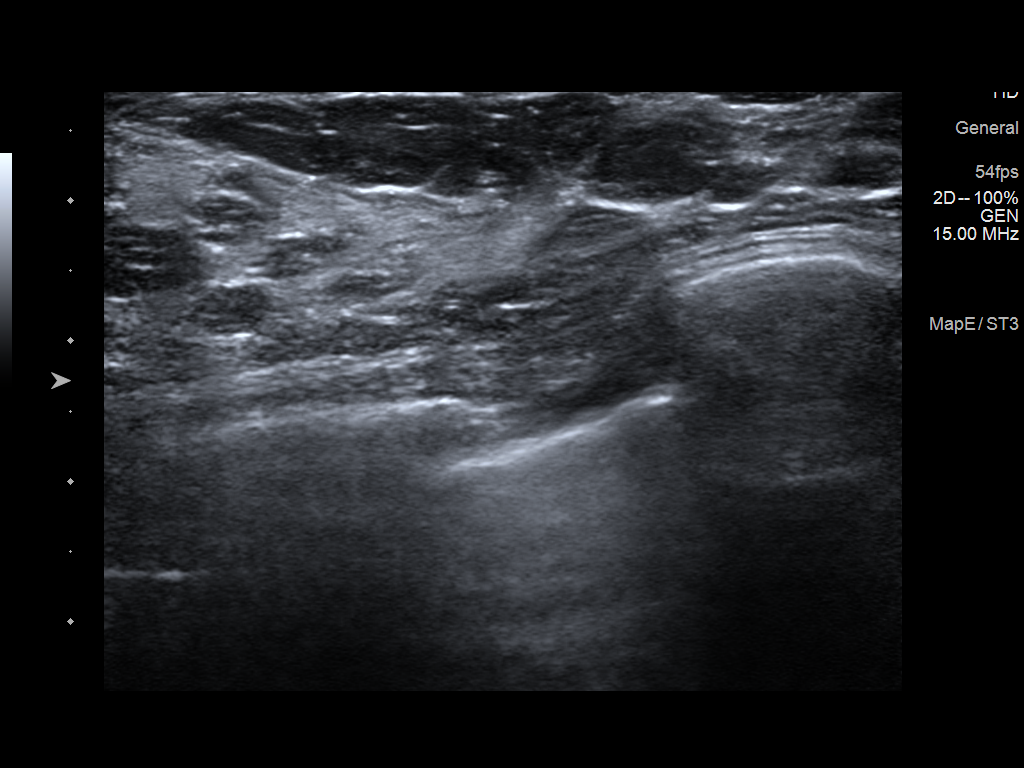
[im 5/7]
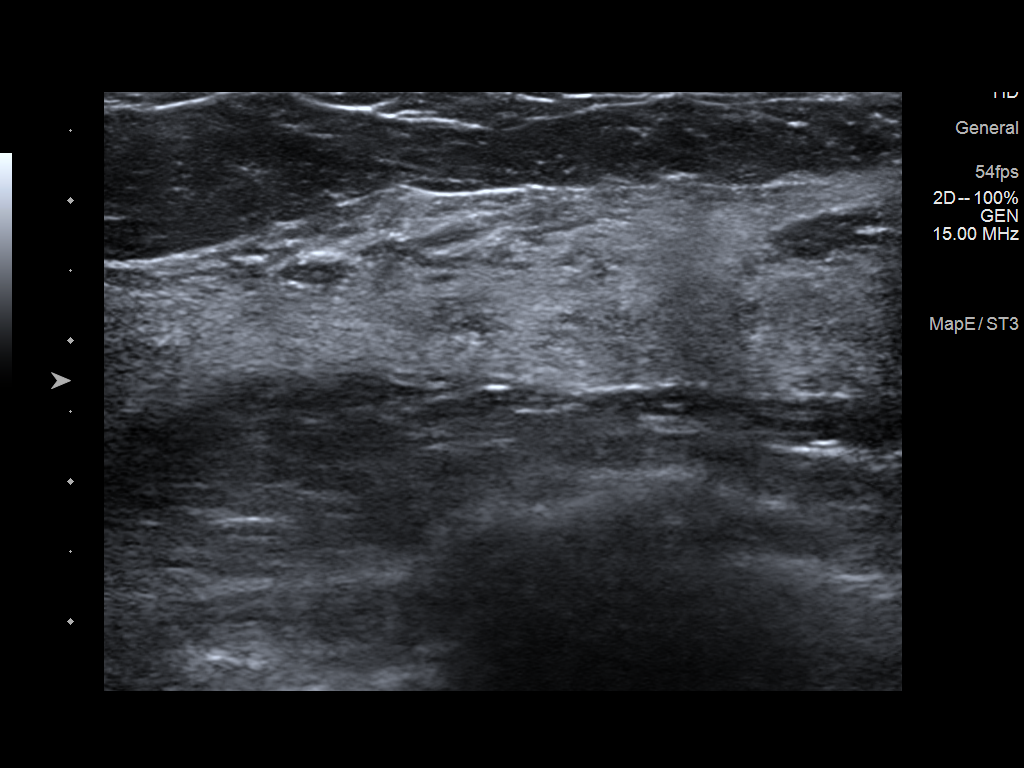
[im 6/7]
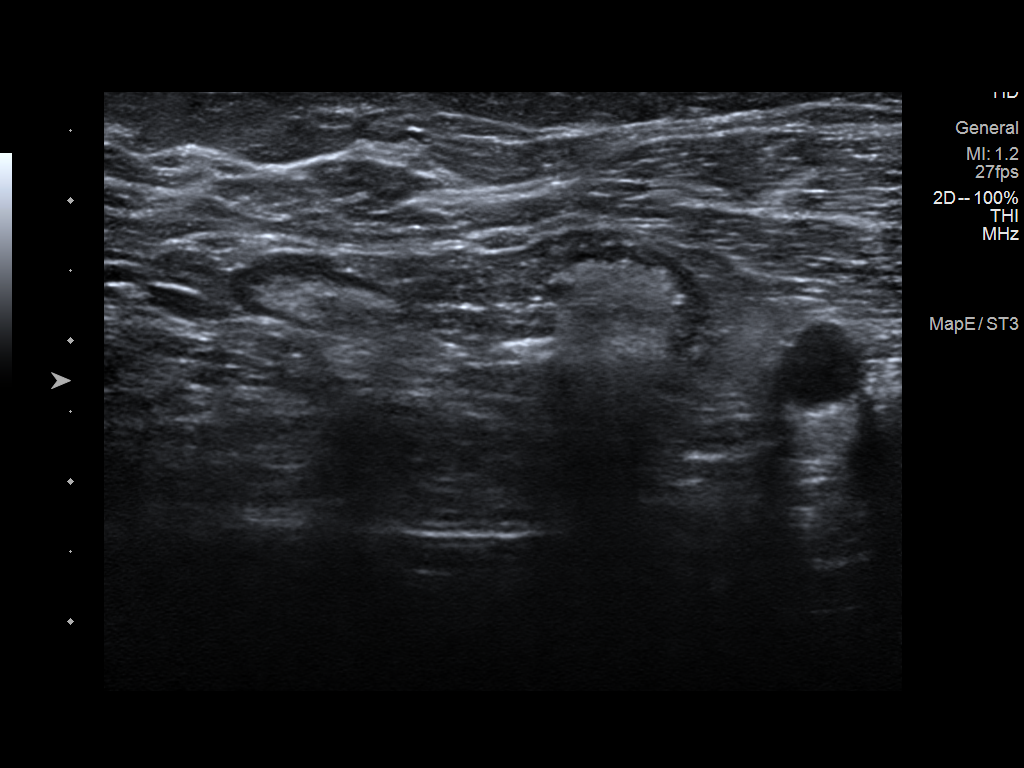
[im 7/7]
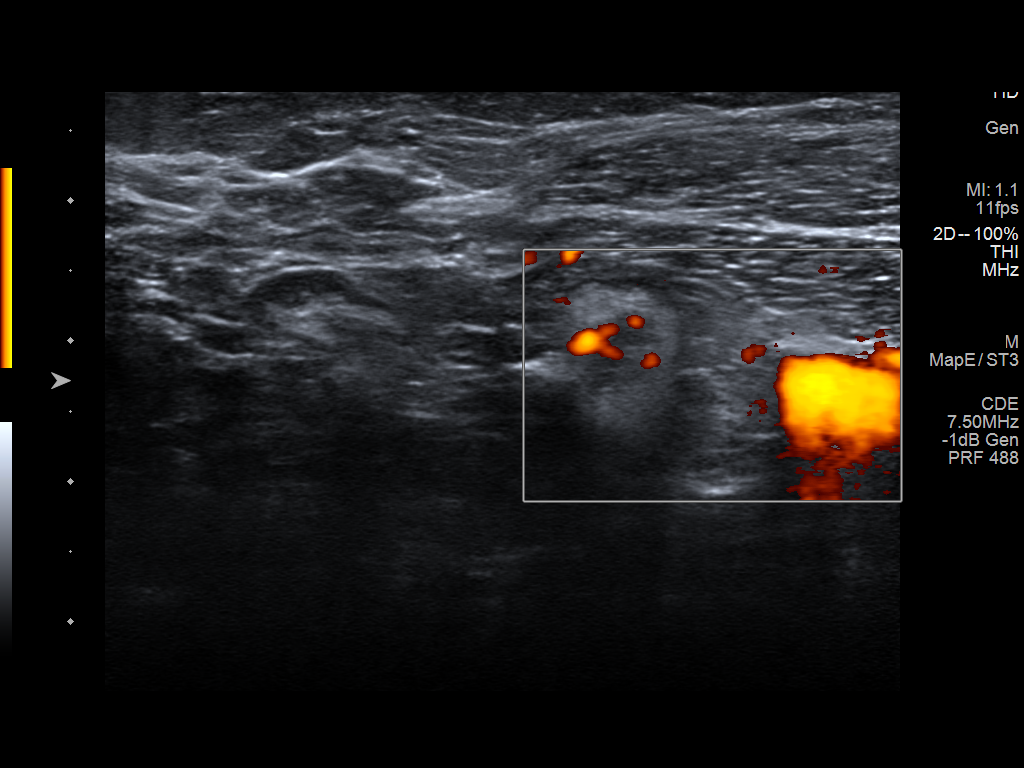

[7 of 7 positions shown; findings below may reference images not displayed]

FINDINGS: Targeted ultrasound is performed, showing normal fibroglandular
tissue in the regions of patient tenderness in the upper central
left breast, upper central right breast, and lower inner quadrant of
the right breast.

The bilateral axillary regions are negative. Negative for
lymphadenopathy.
IMPRESSION: No sonographic evidence of malignancy in either breast. Negative for
axillary lymphadenopathy bilaterally.

RECOMMENDATION:
Screening mammogram at age 38 unless there are persistent or
intervening clinical concerns. (Code:SI-S-ZKY)

I have discussed the findings and recommendations with the patient.
Results were also provided in writing at the conclusion of the
visit. If applicable, a reminder letter will be sent to the patient
regarding the next appointment.

BI-RADS CATEGORY  1: Negative.

## 2019-09-02 ENCOUNTER — Other Ambulatory Visit: Payer: Self-pay | Admitting: Obstetrics and Gynecology

## 2019-09-04 ENCOUNTER — Other Ambulatory Visit: Payer: Self-pay | Admitting: Physician Assistant

## 2019-09-17 ENCOUNTER — Ambulatory Visit: Payer: Managed Care, Other (non HMO) | Admitting: Obstetrics and Gynecology

## 2019-09-18 NOTE — Progress Notes (Signed)
29 y.o. G62P2002 Married [place] female here for annual exam.    Patient states she was treated for UTI last week and would like urine checked today. Completed Bactrim for 5 days through telemedicine consultation and feeling better. Hx UTIs in [place].  Urine Dip: Did not dip--patient on cycle  She was taking Effexor XR 37.5 mg and she had an anxiety attack.  She had a consultation with her PCP, who recommended Prozac for one month, and then stop the Effexor XR.  She is feeling better on Prozac 10 mg and is taking every other day.  Feels well when she is working out.  She does note PMS symptoms.   Her menstrual cramping is much better.  Takes Ibuprofen or Midol.  Flow is more controlled.   Living in [place] due to her husband's work.   Received her Covid vaccine.   Father had Covid in [place].   PCP:   Jarold Motto, PA  Patient's last menstrual period was 09/20/2019 (exact date).           Sexually active: Yes.    The current method of family planning is IUD--Paragard 07/2015.    Exercising: Yes.    Gym/ health club routine includes cardio and light weights. Smoker:  no  Health Maintenance: Pap: 08-15-17 Neg, 09-02-14 Neg History of abnormal Pap:  no MMG: 01-10-18 Bil.Br.US/Neg/BiRads1--screening age 33 Colonoscopy:  n/a BMD:   n/a  Result  n/a TDaP:  08-31-16 Gardasil:   no HIV:01-04-17 NR Hep C:never Screening Labs:  PCP.    reports that she has never smoked. She has never used smokeless tobacco. She reports previous alcohol use. She reports that she does not use drugs.  Past Medical History:  Diagnosis Date  . Anxiety   . 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 Headache    occasional migraines during second trimester; none during third trimester  . History of chicken pox   . Hx of urinary tract infection   . Keloid   . Postpartum care following cesarean delivery (4/28) 06/19/2013  . Postpartum care following VBAC delivery (4/27) 06/18/2015    Past  Surgical History:  Procedure Laterality Date  . CESAREAN SECTION N/A 06/18/2013   Procedure: CESAREAN SECTION;  Surgeon: Genia Del, MD;  Location: [facility];  Service: Obstetrics;  Laterality: N/A;  . CESAREAN SECTION  2015    Current Outpatient Medications  Medication Sig Dispense Refill  . ascorbic acid (VITAMIN C) 250 MG tablet Take 250 mg by mouth daily.    Marland Kitchen FLUoxetine (PROZAC) 10 MG capsule TAKE 1 CAPSULE BY MOUTH EVERY DAY 90 capsule 1  . hydrOXYzine (ATARAX/VISTARIL) 25 MG tablet TAKE 1 TABLET (25 MG TOTAL) BY MOUTH 30-60 MINUTES PRIOR TO BEDTIME AS NEEDED FOR ANXIETY AND INSOMNIA. MAY INCREASE TO TWO TABLETS. 180 tablet 1  . ibuprofen (ADVIL,MOTRIN) 200 MG tablet Take 400 mg as needed by mouth.    . Multiple Vitamin (MULTIVITAMIN) tablet Take 1 tablet by mouth daily.    Marland Kitchen PARAGARD INTRAUTERINE COPPER IU by Intrauterine route. IUD was placed 07/2015 by GYN, needs to be removed in 07/2025.    Marland Kitchen SUMAtriptan (IMITREX) 25 MG tablet Take 1 tablet (25 mg total) by mouth every 2 (two) hours as needed for migraine. May repeat in 2 hours if headache persists or recurs. 10 tablet 0  . sulfamethoxazole-trimethoprim (BACTRIM DS) 800-160 MG tablet SMARTSIG:1 Tablet(s) By Mouth Every 12 Hours (Patient not taking: Reported on 09/23/2019)     No current facility-administered medications for this  visit.    Family History  Problem Relation Age of Onset  . Hypertension Father   . Hyperlipidemia Father   . Thyroid nodules Sister   . Hypertension Sister   . Diabetes Paternal Grandmother   . Breast cancer Neg Hx   . Colon cancer Neg Hx     Review of Systems  All other systems reviewed and are negative.   Exam:   BP 110/70   Pulse 70   Resp 20   Ht 4' 11.5" (1.511 m)   Wt 130 lb (59 kg)   LMP 09/20/2019 (Exact Date)   BMI 25.82 kg/m     General appearance: alert, cooperative and appears stated age Head: normocephalic, without obvious abnormality, atraumatic Neck: no adenopathy,  supple, symmetrical, trachea midline and thyroid normal to inspection and palpation Lungs: clear to auscultation bilaterally Breasts: normal appearance, no masses or tenderness, No nipple retraction or dimpling, No nipple discharge or bleeding, No axillary adenopathy Heart: regular rate and rhythm Abdomen: soft, non-tender; no masses, no organomegaly Extremities: extremities normal, atraumatic, no cyanosis or edema Skin: skin color, texture, turgor normal. No rashes or lesions Lymph nodes: cervical, supraclavicular, and axillary nodes normal. Neurologic: grossly normal  Pelvic: External genitalia:  no lesions              No abnormal inguinal nodes palpated.              Urethra:  normal appearing urethra with no masses, tenderness or lesions              Bartholins and Skenes: normal                 Vagina: normal appearing vagina with normal color and discharge, no lesions              Cervix: no lesions.  Menstrual flow.  Unable to see strings, which is not new.              Pap taken: No. Bimanual Exam:  Uterus:  normal size, contour, position, consistency, mobility, non-tender              Adnexa: no mass, fullness, tenderness        Chaperone was present for exam.  Assessment:   Well woman visit with normal exam. Hx migraine with aura.  Anxiety.  On Prozac 10 mg every other day. PMDD. Paragard IUD.  Strings not visible.  IUD seen in normal position on 09/06/18.  I suspect the strings may be inside the cervical canal. Dysmenorrhea and ovulatory pain.  Normal pelvic US 09/06/18.   Plan: Mammogram screening age 56.  Return for pap and HR HPV. Guidelines for Calcium, Vitamin D, regular exercise program including cardiovascular and weight bearing exercise. Discussed Gardasil vaccine.   She will consider.  We reviewed Prozac 10 mg daily for 2 weeks prior to menstruation to treat PMDD. Follow up annually and prn.   After visit summary provided.

## 2019-09-23 ENCOUNTER — Ambulatory Visit: Payer: Managed Care, Other (non HMO) | Admitting: Obstetrics and Gynecology

## 2019-09-23 ENCOUNTER — Encounter: Payer: Self-pay | Admitting: Obstetrics and Gynecology

## 2019-09-23 ENCOUNTER — Other Ambulatory Visit: Payer: Self-pay

## 2019-09-23 VITALS — BP 110/70 | HR 70 | Resp 20 | Ht 59.5 in | Wt 130.0 lb

## 2019-09-23 DIAGNOSIS — Z01419 Encounter for gynecological examination (general) (routine) without abnormal findings: Secondary | ICD-10-CM

## 2019-09-23 NOTE — Patient Instructions (Signed)
EXERCISE AND DIET:  We recommended that you start or continue a regular exercise program for good health. Regular exercise means any activity that makes your heart beat faster and makes you sweat.  We recommend exercising at least 30 minutes per day at least 3 days a week, preferably 4 or 5.  We also recommend a diet low in fat and sugar.  Inactivity, poor dietary choices and obesity can cause diabetes, heart attack, stroke, and kidney damage, among others.    ALCOHOL AND SMOKING:  Women should limit their alcohol intake to no more than 7 drinks/beers/glasses of wine (combined, not each!) per week. Moderation of alcohol intake to this level decreases your risk of breast cancer and liver damage. And of course, no recreational drugs are part of a healthy lifestyle.  And absolutely no smoking or even second hand smoke. Most people know smoking can cause heart and lung diseases, but did you know it also contributes to weakening of your bones? Aging of your skin?  Yellowing of your teeth and nails?  CALCIUM AND VITAMIN D:  Adequate intake of calcium and Vitamin D are recommended.  The recommendations for exact amounts of these supplements seem to change often, but generally speaking 600 mg of calcium (either carbonate or citrate) and 800 units of Vitamin D per day seems prudent. Certain women may benefit from higher intake of Vitamin D.  If you are among these women, your doctor will have told you during your visit.    PAP SMEARS:  Pap smears, to check for cervical cancer or precancers,  have traditionally been done yearly, although recent scientific advances have shown that most women can have pap smears less often.  However, every woman still should have a physical exam from her gynecologist every year. It will include a breast check, inspection of the vulva and vagina to check for abnormal growths or skin changes, a visual exam of the cervix, and then an exam to evaluate the size and shape of the uterus and  ovaries.  And after 31 years of age, a rectal exam is indicated to check for rectal cancers. We will also provide age appropriate advice regarding health maintenance, like when you should have certain vaccines, screening for sexually transmitted diseases, bone density testing, colonoscopy, mammograms, etc.   MAMMOGRAMS:  All women over 52 years old should have a yearly mammogram. Many facilities now offer a "3D" mammogram, which may cost around $50 extra out of pocket. If possible,  we recommend you accept the option to have the 3D mammogram performed.  It both reduces the number of women who will be called back for extra views which then turn out to be normal, and it is better than the routine mammogram at detecting truly abnormal areas.    COLONOSCOPY:  Colonoscopy to screen for colon cancer is recommended for all women at age 18.  We know, you hate the idea of the prep.  We agree, BUT, having colon cancer and not knowing it is worse!!  Colon cancer so often starts as a polyp that can be seen and removed at colonscopy, which can quite literally save your life!  And if your first colonoscopy is normal and you have no family history of colon cancer, most women don't have to have it again for 10 years.  Once every ten years, you can do something that may end up saving your life, right?  We will be happy to help you get it scheduled when you are ready.  Be sure to check your insurance coverage so you understand how much it will cost.  It may be covered as a preventative service at no cost, but you should check your particular policy.      Premenstrual Syndrome Premenstrual syndrome (PMS) is a group of physical, emotional, and behavioral symptoms that affect women of childbearing age as part of their menstrual cycle. PMS starts 1-2 weeks before the start of a woman's menstrual period and goes away a few days after menstrual bleeding starts. It often happens in a predictable pattern (recurs). PMS may cause  other health conditions to become worse, such as asthma, allergies, and migraines. PMS can range from mild to severe. When it is severe, it is called premenstrual dysphoric disorder (PMDD). PMS may interfere with normal daily activities. What are the causes? The cause of this condition is not known, but it seems to be related to hormone changes that happen before menstruation. What are the signs or symptoms? Symptoms of this condition often happen every month. They go away completely after your period starts. Physical symptoms of this condition include:  Bloating.  Breast pain.  Headaches.  Extreme fatigue.  Backaches.  Swelling of the hands and feet.  Weight gain.  Hot flashes. Emotional and behavioral symptoms of this condition include:  Mood swings.  Depression.  Angry outbursts.  Irritability.  Anxiety.  Crying spells.  Food cravings or appetite changes.  Changes in sexual desire.  Confusion.  Aggression.  Social withdrawal.  Poor concentration. How is this diagnosed? This condition may be diagnosed based on a history of your symptoms. This condition is generally diagnosed if symptoms of PMS:  Are present in the 5 days before your period starts.  End within 4 days after your period starts.  Happen at least 3 months in a row.  Interfere with some of your normal activities. Other conditions that can cause some of these symptoms must be ruled out before PMS can be diagnosed. These include depression, anxiety, anemia, and thyroid problems. How is this treated? This condition may be treated by:  Maintaining a healthy lifestyle. This includes eating a well-balanced diet and exercising regularly.  Taking medicines. Medicines can help relieve symptoms such as cramps, aches, pains, headaches, and breast tenderness. Depending on the severity of the condition, your health care provider may recommend various over-the-counter pain medicines. Follow these  instructions at home: Eating and drinking   Eat a well-balanced diet.  Avoid caffeine and alcohol.  Limit the amount of salt and salty foods you eat. This will help reduce bloating.  Drink enough fluid to keep your urine pale yellow.  Take a multivitamin if told to do so by your health care provider. Lifestyle   Do not use any products that contain nicotine or tobacco, such as cigarettes, e-cigarettes, and chewing tobacco. If you need help quitting, ask your health care provider.  Exercise regularly as suggested by your health care provider.  Get enough sleep. For most adults, this is 7-8 hours of sleep each night.  Practice relaxation techniques such as yoga, tai chi, or meditation.  Find healthy ways to manage stress. General instructions   For 2-3 months, write down your symptoms, their severity, and how long they last. This will help your health care provider choose the best treatment for you.  Take over-the-counter and prescription medicines only as told by your health care provider.  If you are using birth control pills (oral contraceptives), use them as told by your health care  provider. Contact a health care provider if:  Your symptoms get worse.  You develop new symptoms.  You have trouble doing your daily activities. Summary  Premenstrual syndrome (PMS) is a group of physical, emotional, and behavioral symptoms that affect women of childbearing age.  PMS starts 1-2 weeks before the start of a woman's period and goes away a few days after the period starts.  PMS is treated by maintaining a healthy lifestyle and taking medicines to relieve the symptoms. This information is not intended to replace advice given to you by your health care provider. Make sure you discuss any questions you have with your health care provider. Document Revised: 09/20/2017 Document Reviewed: 09/20/2017 Elsevier Patient Education  [employer].

## 2019-10-01 NOTE — Progress Notes (Deleted)
GYNECOLOGY  VISIT   HPI: 29 y.o.   Married  [place]  female   [phone] with Patient's last menstrual period was 09/20/2019 (exact date).   here for PAP smear to be done as her menses was too heavy at her physical to perform.   GYNECOLOGIC HISTORY: Patient's last menstrual period was 09/20/2019 (exact date). Contraception:  IUD - Paragard 07/2015 Menopausal hormone therapy:  NA Last mammogram:  NA Last pap smear:   08-15-17 Neg, 09-02-14 Neg        OB History    Gravida  2   Para  2   Term  2   Preterm  0   AB  0   Living  2     SAB  0   TAB  0   Ectopic  0   Multiple      Live Births  2              Patient Active Problem List   Diagnosis Date Noted  . Migraine with aura and without status migrainosus, not intractable 01/11/2019  . Vitamin D deficiency 01/11/2019  . Situational anxiety 08/02/2018    Past Medical History:  Diagnosis Date  . Anxiety   . 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 Headache    occasional migraines during second trimester; none during third trimester  . History of chicken pox   . Hx of urinary tract infection   . Keloid   . Postpartum care following cesarean delivery (4/28) 06/19/2013  . Postpartum care following VBAC delivery (4/27) 06/18/2015    Past Surgical History:  Procedure Laterality Date  . CESAREAN SECTION N/A 06/18/2013   Procedure: CESAREAN SECTION;  Surgeon: Genia Del, MD;  Location: [facility];  Service: Obstetrics;  Laterality: N/A;  . CESAREAN SECTION  2015    Current Outpatient Medications  Medication Sig Dispense Refill  . ascorbic acid (VITAMIN C) 250 MG tablet Take 250 mg by mouth daily.    Marland Kitchen FLUoxetine (PROZAC) 10 MG capsule TAKE 1 CAPSULE BY MOUTH EVERY DAY 90 capsule 1  . hydrOXYzine (ATARAX/VISTARIL) 25 MG tablet TAKE 1 TABLET (25 MG TOTAL) BY MOUTH 30-60 MINUTES PRIOR TO BEDTIME AS NEEDED FOR ANXIETY AND INSOMNIA. MAY INCREASE TO TWO TABLETS. 180 tablet 1  . ibuprofen (ADVIL,MOTRIN) 200 MG  tablet Take 400 mg as needed by mouth.    . Multiple Vitamin (MULTIVITAMIN) tablet Take 1 tablet by mouth daily.    Marland Kitchen PARAGARD INTRAUTERINE COPPER IU by Intrauterine route. IUD was placed 07/2015 by GYN, needs to be removed in 07/2025.    . sulfamethoxazole-trimethoprim (BACTRIM DS) 800-160 MG tablet SMARTSIG:1 Tablet(s) By Mouth Every 12 Hours (Patient not taking: Reported on 09/23/2019)    . SUMAtriptan (IMITREX) 25 MG tablet Take 1 tablet (25 mg total) by mouth every 2 (two) hours as needed for migraine. May repeat in 2 hours if headache persists or recurs. 10 tablet 0   No current facility-administered medications for this visit.     ALLERGIES: Reglan [metoclopramide]  Family History  Problem Relation Age of Onset  . Hypertension Father   . Hyperlipidemia Father   . Thyroid nodules Sister   . Hypertension Sister   . Diabetes Paternal Grandmother   . Breast cancer Neg Hx   . Colon cancer Neg Hx     Social History   Socioeconomic History  . Marital status: Married    Spouse name: Not on file  . Number of children: Not on file  . Years  of education: Not on file  . Highest education level: Not on file  Occupational History  . Not on file  Tobacco Use  . Smoking status: Never Smoker  . Smokeless tobacco: Never Used  Vaping Use  . Vaping Use: Never used  Substance and Sexual Activity  . Alcohol use: Not Currently  . Drug use: No  . Sexual activity: Yes    Partners: Male    Birth control/protection: I.U.D.    Comment: Paragard June 2017  Other Topics Concern  . Not on file  Social History Narrative   ** Merged History Encounter **       From [place], moved here 4 years ago 2 children Married Husband works, she is a Teaching laboratory technician Strain:   . Difficulty of Paying Living Expenses:   Food Insecurity:   . Worried About Programme researcher, broadcasting/film/video in the Last Year:   . Barista in the Last Year:   Transportation Needs:   .  Freight forwarder (Medical):   Marland Kitchen Lack of Transportation (Non-Medical):   Physical Activity:   . Days of Exercise per Week:   . Minutes of Exercise per Session:   Stress:   . Feeling of Stress :   Social Connections:   . Frequency of Communication with Friends and Family:   . Frequency of Social Gatherings with Friends and Family:   . Attends Religious Services:   . Active Member of Clubs or Organizations:   . Attends Banker Meetings:   Marland Kitchen Marital Status:   Intimate Partner Violence:   . Fear of Current or Ex-Partner:   . Emotionally Abused:   Marland Kitchen Physically Abused:   . Sexually Abused:     Review of Systems  PHYSICAL EXAMINATION:    LMP 09/20/2019 (Exact Date)     General appearance: alert, cooperative and appears stated age Head: Normocephalic, without obvious abnormality, atraumatic Neck: no adenopathy, supple, symmetrical, trachea midline and thyroid normal to inspection and palpation Lungs: clear to auscultation bilaterally Breasts: normal appearance, no masses or tenderness, No nipple retraction or dimpling, No nipple discharge or bleeding, No axillary or supraclavicular adenopathy Heart: regular rate and rhythm Abdomen: soft, non-tender, no masses,  no organomegaly Extremities: extremities normal, atraumatic, no cyanosis or edema Skin: Skin color, texture, turgor normal. No rashes or lesions Lymph nodes: Cervical, supraclavicular, and axillary nodes normal. No abnormal inguinal nodes palpated Neurologic: Grossly normal  Pelvic: External genitalia:  no lesions              Urethra:  normal appearing urethra with no masses, tenderness or lesions              Bartholins and Skenes: normal                 Vagina: normal appearing vagina with normal color and discharge, no lesions              Cervix: no lesions                Bimanual Exam:  Uterus:  normal size, contour, position, consistency, mobility, non-tender              Adnexa: no mass,  fullness, tenderness              Rectal exam: {yes no:314532}.  Confirms.              Anus:  normal sphincter tone,  no lesions  Chaperone was present for exam.  ASSESSMENT     PLAN     An After Visit Summary was printed and given to the patient.  ______ minutes face to face time of which over 50% was spent in counseling.

## 2019-10-01 NOTE — Progress Notes (Deleted)
GYNECOLOGY  VISIT   HPI: 29 y.o.   Married  [place]  female   [phone] with Patient's last menstrual period was 09/20/2019 (exact date).   here for pap smear.     GYNECOLOGIC HISTORY: Patient's last menstrual period was 09/20/2019 (exact date). Contraception: Paragard IUD 07/2015 Menopausal hormone therapy:  none Last mammogram:01-10-18 Bil.Br.US/Neg/BiRads1--screening age 25  Last pap smear: 08-15-17 Neg, 09-02-14 Neg        OB History    Gravida  2   Para  2   Term  2   Preterm  0   AB  0   Living  2     SAB  0   TAB  0   Ectopic  0   Multiple      Live Births  2              Patient Active Problem List   Diagnosis Date Noted  . Migraine with aura and without status migrainosus, not intractable 01/11/2019  . Vitamin D deficiency 01/11/2019  . Situational anxiety 08/02/2018    Past Medical History:  Diagnosis Date  . Anxiety   . 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 Headache    occasional migraines during second trimester; none during third trimester  . History of chicken pox   . Hx of urinary tract infection   . Keloid   . Postpartum care following cesarean delivery (4/28) 06/19/2013  . Postpartum care following VBAC delivery (4/27) 06/18/2015    Past Surgical History:  Procedure Laterality Date  . CESAREAN SECTION N/A 06/18/2013   Procedure: CESAREAN SECTION;  Surgeon: Genia Del, MD;  Location: [facility];  Service: Obstetrics;  Laterality: N/A;  . CESAREAN SECTION  2015    Current Outpatient Medications  Medication Sig Dispense Refill  . ascorbic acid (VITAMIN C) 250 MG tablet Take 250 mg by mouth daily.    Marland Kitchen FLUoxetine (PROZAC) 10 MG capsule TAKE 1 CAPSULE BY MOUTH EVERY DAY 90 capsule 1  . hydrOXYzine (ATARAX/VISTARIL) 25 MG tablet TAKE 1 TABLET (25 MG TOTAL) BY MOUTH 30-60 MINUTES PRIOR TO BEDTIME AS NEEDED FOR ANXIETY AND INSOMNIA. MAY INCREASE TO TWO TABLETS. 180 tablet 1  . ibuprofen (ADVIL,MOTRIN) 200 MG tablet Take 400 mg as  needed by mouth.    . Multiple Vitamin (MULTIVITAMIN) tablet Take 1 tablet by mouth daily.    Marland Kitchen PARAGARD INTRAUTERINE COPPER IU by Intrauterine route. IUD was placed 07/2015 by GYN, needs to be removed in 07/2025.    . sulfamethoxazole-trimethoprim (BACTRIM DS) 800-160 MG tablet SMARTSIG:1 Tablet(s) By Mouth Every 12 Hours (Patient not taking: Reported on 09/23/2019)    . SUMAtriptan (IMITREX) 25 MG tablet Take 1 tablet (25 mg total) by mouth every 2 (two) hours as needed for migraine. May repeat in 2 hours if headache persists or recurs. 10 tablet 0   No current facility-administered medications for this visit.     ALLERGIES: Reglan [metoclopramide]  Family History  Problem Relation Age of Onset  . Hypertension Father   . Hyperlipidemia Father   . Thyroid nodules Sister   . Hypertension Sister   . Diabetes Paternal Grandmother   . Breast cancer Neg Hx   . Colon cancer Neg Hx     Social History   Socioeconomic History  . Marital status: Married    Spouse name: Not on file  . Number of children: Not on file  . Years of education: Not on file  . Highest education level: Not on file  Occupational History  . Not on file  Tobacco Use  . Smoking status: Never Smoker  . Smokeless tobacco: Never Used  Vaping Use  . Vaping Use: Never used  Substance and Sexual Activity  . Alcohol use: Not Currently  . Drug use: No  . Sexual activity: Yes    Partners: Male    Birth control/protection: I.U.D.    Comment: Paragard June 2017  Other Topics Concern  . Not on file  Social History Narrative   ** Merged History Encounter **       From [place], moved here 4 years ago 2 children Married Husband works, she is a Teaching laboratory technician Strain:   . Difficulty of Paying Living Expenses:   Food Insecurity:   . Worried About Programme researcher, broadcasting/film/video in the Last Year:   . Barista in the Last Year:   Transportation Needs:   . Freight forwarder  (Medical):   Marland Kitchen Lack of Transportation (Non-Medical):   Physical Activity:   . Days of Exercise per Week:   . Minutes of Exercise per Session:   Stress:   . Feeling of Stress :   Social Connections:   . Frequency of Communication with Friends and Family:   . Frequency of Social Gatherings with Friends and Family:   . Attends Religious Services:   . Active Member of Clubs or Organizations:   . Attends Banker Meetings:   Marland Kitchen Marital Status:   Intimate Partner Violence:   . Fear of Current or Ex-Partner:   . Emotionally Abused:   Marland Kitchen Physically Abused:   . Sexually Abused:     Review of Systems  PHYSICAL EXAMINATION:    LMP 09/20/2019 (Exact Date)     General appearance: alert, cooperative and appears stated age Head: Normocephalic, without obvious abnormality, atraumatic Neck: no adenopathy, supple, symmetrical, trachea midline and thyroid normal to inspection and palpation Lungs: clear to auscultation bilaterally Breasts: normal appearance, no masses or tenderness, No nipple retraction or dimpling, No nipple discharge or bleeding, No axillary or supraclavicular adenopathy Heart: regular rate and rhythm Abdomen: soft, non-tender, no masses,  no organomegaly Extremities: extremities normal, atraumatic, no cyanosis or edema Skin: Skin color, texture, turgor normal. No rashes or lesions Lymph nodes: Cervical, supraclavicular, and axillary nodes normal. No abnormal inguinal nodes palpated Neurologic: Grossly normal  Pelvic: External genitalia:  no lesions              Urethra:  normal appearing urethra with no masses, tenderness or lesions              Bartholins and Skenes: normal                 Vagina: normal appearing vagina with normal color and discharge, no lesions              Cervix: no lesions                Bimanual Exam:  Uterus:  normal size, contour, position, consistency, mobility, non-tender              Adnexa: no mass, fullness, tenderness               Rectal exam: {yes no:314532}.  Confirms.              Anus:  normal sphincter tone, no lesions  Chaperone was present for exam.  ASSESSMENT  PLAN     An After Visit Summary was printed and given to the patient.  ______ minutes face to face time of which over 50% was spent in counseling.

## 2019-10-07 ENCOUNTER — Ambulatory Visit: Payer: Managed Care, Other (non HMO) | Admitting: Obstetrics and Gynecology

## 2019-10-07 ENCOUNTER — Telehealth: Payer: Self-pay | Admitting: Obstetrics and Gynecology

## 2019-10-07 NOTE — Telephone Encounter (Signed)
Patient rescheduled her pap appointment today. She said" I have a fever and do not think it is a good for me to come in today".

## 2019-10-07 NOTE — Telephone Encounter (Signed)
Message to triage to reschedule her cervical cancer screening.   I agree with no appointment due to fever.

## 2019-10-07 NOTE — Telephone Encounter (Signed)
Per review of Epic, OV was rescheduled to 11/28/19 at 4pm.   Encounter closed.

## 2019-11-28 ENCOUNTER — Telehealth: Payer: Self-pay

## 2019-11-28 ENCOUNTER — Ambulatory Visit: Payer: Managed Care, Other (non HMO) | Admitting: Obstetrics and Gynecology

## 2019-11-28 NOTE — Telephone Encounter (Signed)
Patient cancelled AEX today due to covid exposure. Patient stated she would like to call back to reschedule later on.

## 2019-11-28 NOTE — Telephone Encounter (Signed)
See staff message

## 2020-03-18 ENCOUNTER — Other Ambulatory Visit: Payer: Self-pay | Admitting: Physician Assistant

## 2020-03-19 MED ORDER — SUMATRIPTAN SUCCINATE 25 MG PO TABS: 25.0000 mg | ORAL_TABLET | ORAL | 0 refills | Status: DC | PRN

## 2020-03-24 ENCOUNTER — Encounter: Payer: Self-pay | Admitting: Physician Assistant

## 2020-03-25 ENCOUNTER — Other Ambulatory Visit: Payer: Self-pay | Admitting: Physician Assistant

## 2020-03-25 MED ORDER — NARATRIPTAN HCL 1 MG PO TABS: 1.0000 mg | ORAL_TABLET | Freq: Once | ORAL | 5 refills | Status: AC | PRN

## 2020-04-06 ENCOUNTER — Encounter: Payer: Self-pay | Admitting: Physician Assistant

## 2020-04-07 ENCOUNTER — Encounter: Payer: Managed Care, Other (non HMO) | Admitting: Physician Assistant

## 2020-04-14 ENCOUNTER — Other Ambulatory Visit: Payer: Self-pay | Admitting: Physician Assistant

## 2020-09-23 ENCOUNTER — Ambulatory Visit: Payer: Managed Care, Other (non HMO) | Admitting: Obstetrics and Gynecology
